# Patient Record
Sex: Female | Born: 1990 | Race: Black or African American | Hispanic: No | Marital: Single | Smoking: Never smoker
Health system: Southern US, Community
[De-identification: ages and names within clinical notes are randomized; demographics above are authoritative.]

## PROBLEM LIST (undated history)

## (undated) ENCOUNTER — Inpatient Hospital Stay (HOSPITAL_COMMUNITY): Payer: Self-pay

## (undated) DIAGNOSIS — O009 Unspecified ectopic pregnancy without intrauterine pregnancy: Secondary | ICD-10-CM

## (undated) DIAGNOSIS — K8020 Calculus of gallbladder without cholecystitis without obstruction: Secondary | ICD-10-CM

## (undated) HISTORY — PX: INDUCED ABORTION: SHX677

## (undated) HISTORY — PX: DILATION AND CURETTAGE OF UTERUS: SHX78

---
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
## 2009-04-07 ENCOUNTER — Ambulatory Visit: Payer: Self-pay | Admitting: Family Medicine

## 2009-04-07 ENCOUNTER — Inpatient Hospital Stay (HOSPITAL_COMMUNITY): Admission: AD | Admit: 2009-04-07 | Discharge: 2009-04-07 | Payer: Self-pay | Admitting: Obstetrics & Gynecology

## 2009-04-11 ENCOUNTER — Ambulatory Visit (HOSPITAL_COMMUNITY): Admission: RE | Admit: 2009-04-11 | Discharge: 2009-04-11 | Payer: Self-pay | Admitting: Obstetrics & Gynecology

## 2009-06-08 ENCOUNTER — Ambulatory Visit: Payer: Self-pay | Admitting: Obstetrics & Gynecology

## 2009-06-08 ENCOUNTER — Encounter: Payer: Self-pay | Admitting: Obstetrics and Gynecology

## 2009-06-08 LAB — CONVERTED CEMR LAB
Hgb F Quant: 0 % (ref 0.0–2.0)
Hgb S Quant: 38 % — ABNORMAL HIGH (ref 0.0–0.0)
Lymphocytes Relative: 11 % — ABNORMAL LOW (ref 12–46)
Lymphs Abs: 1.8 10*3/uL (ref 0.7–4.0)
Neutrophils Relative %: 79 % — ABNORMAL HIGH (ref 43–77)
Platelets: 279 10*3/uL (ref 150–400)
Rubella: 8.9 intl units/mL — ABNORMAL HIGH
WBC: 16 10*3/uL — ABNORMAL HIGH (ref 4.0–10.5)

## 2009-06-13 ENCOUNTER — Inpatient Hospital Stay (HOSPITAL_COMMUNITY): Admission: AD | Admit: 2009-06-13 | Discharge: 2009-06-14 | Payer: Self-pay | Admitting: Obstetrics & Gynecology

## 2009-06-17 ENCOUNTER — Ambulatory Visit (HOSPITAL_COMMUNITY): Admission: RE | Admit: 2009-06-17 | Discharge: 2009-06-17 | Payer: Self-pay | Admitting: Family Medicine

## 2009-06-22 ENCOUNTER — Ambulatory Visit: Payer: Self-pay | Admitting: Obstetrics & Gynecology

## 2009-07-13 ENCOUNTER — Ambulatory Visit: Payer: Self-pay | Admitting: Obstetrics & Gynecology

## 2009-08-03 ENCOUNTER — Ambulatory Visit: Payer: Self-pay | Admitting: Obstetrics and Gynecology

## 2009-08-04 ENCOUNTER — Encounter: Payer: Self-pay | Admitting: Obstetrics and Gynecology

## 2009-08-04 LAB — CONVERTED CEMR LAB: GC Probe Amp, Genital: NEGATIVE

## 2009-08-07 ENCOUNTER — Inpatient Hospital Stay (HOSPITAL_COMMUNITY): Admission: AD | Admit: 2009-08-07 | Discharge: 2009-08-07 | Payer: Self-pay | Admitting: Obstetrics & Gynecology

## 2009-08-17 ENCOUNTER — Other Ambulatory Visit: Payer: Self-pay | Admitting: Obstetrics and Gynecology

## 2009-08-17 ENCOUNTER — Ambulatory Visit: Payer: Self-pay | Admitting: Obstetrics and Gynecology

## 2009-08-19 ENCOUNTER — Inpatient Hospital Stay (HOSPITAL_COMMUNITY): Admission: AD | Admit: 2009-08-19 | Discharge: 2009-08-24 | Payer: Self-pay | Admitting: Obstetrics & Gynecology

## 2009-08-19 ENCOUNTER — Ambulatory Visit: Payer: Self-pay | Admitting: Obstetrics & Gynecology

## 2009-08-21 ENCOUNTER — Encounter: Payer: Self-pay | Admitting: Obstetrics & Gynecology

## 2010-11-09 ENCOUNTER — Inpatient Hospital Stay (HOSPITAL_COMMUNITY)
Admission: AD | Admit: 2010-11-09 | Discharge: 2010-11-09 | Payer: Self-pay | Source: Home / Self Care | Admitting: Obstetrics and Gynecology

## 2011-03-09 LAB — ANAEROBIC CULTURE

## 2011-03-09 LAB — POCT URINALYSIS DIP (DEVICE)
Bilirubin Urine: NEGATIVE
Bilirubin Urine: NEGATIVE
Glucose, UA: NEGATIVE mg/dL
Glucose, UA: NEGATIVE mg/dL
Hgb urine dipstick: NEGATIVE
Ketones, ur: NEGATIVE mg/dL
Ketones, ur: NEGATIVE mg/dL
Protein, ur: NEGATIVE mg/dL
Specific Gravity, Urine: 1.01 (ref 1.005–1.030)
Specific Gravity, Urine: <=1.005 (ref 1.005–1.030)

## 2011-03-09 LAB — CBC
HCT: 27.6 % — ABNORMAL LOW (ref 36.0–46.0)
MCV: 80.8 fL (ref 78.0–100.0)
Platelets: 296 10*3/uL (ref 150–400)
Platelets: 340 10*3/uL (ref 150–400)
RDW: 16.5 % — ABNORMAL HIGH (ref 11.5–15.5)
WBC: 13.2 10*3/uL — ABNORMAL HIGH (ref 4.0–10.5)
WBC: 21.9 10*3/uL — ABNORMAL HIGH (ref 4.0–10.5)

## 2011-03-09 LAB — RPR: RPR Ser Ql: NONREACTIVE

## 2011-03-09 LAB — WOUND CULTURE

## 2011-03-10 LAB — POCT URINALYSIS DIP (DEVICE)
Bilirubin Urine: NEGATIVE
Glucose, UA: NEGATIVE mg/dL
Hgb urine dipstick: NEGATIVE
Ketones, ur: NEGATIVE mg/dL
Specific Gravity, Urine: 1.01 (ref 1.005–1.030)
pH: 7 (ref 5.0–8.0)

## 2011-03-11 LAB — POCT URINALYSIS DIP (DEVICE)
Bilirubin Urine: NEGATIVE
Glucose, UA: NEGATIVE mg/dL
Hgb urine dipstick: NEGATIVE
Ketones, ur: NEGATIVE mg/dL
Ketones, ur: NEGATIVE mg/dL
Protein, ur: NEGATIVE mg/dL
Protein, ur: NEGATIVE mg/dL
Specific Gravity, Urine: 1.01 (ref 1.005–1.030)
Specific Gravity, Urine: <=1.005 (ref 1.005–1.030)
Urobilinogen, UA: 0.2 mg/dL (ref 0.0–1.0)
pH: 7 (ref 5.0–8.0)

## 2011-03-11 LAB — URINE MICROSCOPIC-ADD ON: RBC / HPF: NONE SEEN RBC/hpf (ref <3)

## 2011-03-11 LAB — URINALYSIS, ROUTINE W REFLEX MICROSCOPIC
Glucose, UA: NEGATIVE mg/dL
Hgb urine dipstick: NEGATIVE
Protein, ur: NEGATIVE mg/dL
pH: 5.5 (ref 5.0–8.0)

## 2011-03-11 LAB — URINE CULTURE

## 2011-03-13 LAB — URINE MICROSCOPIC-ADD ON

## 2011-03-13 LAB — URINALYSIS, ROUTINE W REFLEX MICROSCOPIC
Bilirubin Urine: NEGATIVE
Glucose, UA: NEGATIVE mg/dL
Hgb urine dipstick: NEGATIVE
Specific Gravity, Urine: 1.015 (ref 1.005–1.030)
pH: 6.5 (ref 5.0–8.0)

## 2011-03-13 LAB — URINE CULTURE

## 2011-08-03 ENCOUNTER — Inpatient Hospital Stay (INDEPENDENT_AMBULATORY_CARE_PROVIDER_SITE_OTHER)
Admission: RE | Admit: 2011-08-03 | Discharge: 2011-08-03 | Disposition: A | Discharge status: Home / Self Care | Payer: Self-pay | Source: Ambulatory Visit | Attending: Family Medicine | Admitting: Family Medicine

## 2011-08-03 DIAGNOSIS — IMO0001 Reserved for inherently not codable concepts without codable children: Secondary | ICD-10-CM

## 2011-08-31 ENCOUNTER — Inpatient Hospital Stay (INDEPENDENT_AMBULATORY_CARE_PROVIDER_SITE_OTHER)
Admission: RE | Admit: 2011-08-31 | Discharge: 2011-08-31 | Disposition: A | Discharge status: Home / Self Care | Payer: Self-pay | Source: Ambulatory Visit | Attending: Family Medicine | Admitting: Family Medicine

## 2011-08-31 DIAGNOSIS — J069 Acute upper respiratory infection, unspecified: Secondary | ICD-10-CM

## 2011-08-31 LAB — POCT RAPID STREP A: Streptococcus, Group A Screen (Direct): NEGATIVE

## 2011-10-02 ENCOUNTER — Emergency Department (HOSPITAL_COMMUNITY)
Admission: EM | Admit: 2011-10-02 | Discharge: 2011-10-03 | Disposition: A | Discharge status: Home / Self Care | Payer: Self-pay | Attending: Emergency Medicine | Admitting: Emergency Medicine

## 2011-10-02 ENCOUNTER — Emergency Department (HOSPITAL_COMMUNITY): Payer: Self-pay

## 2011-10-02 ENCOUNTER — Ambulatory Visit (INDEPENDENT_AMBULATORY_CARE_PROVIDER_SITE_OTHER): Payer: Self-pay

## 2011-10-02 ENCOUNTER — Inpatient Hospital Stay (INDEPENDENT_AMBULATORY_CARE_PROVIDER_SITE_OTHER)
Admission: RE | Admit: 2011-10-02 | Discharge: 2011-10-02 | Disposition: A | Discharge status: Home / Self Care | Payer: Self-pay | Source: Ambulatory Visit | Attending: Family Medicine | Admitting: Family Medicine

## 2011-10-02 DIAGNOSIS — I880 Nonspecific mesenteric lymphadenitis: Secondary | ICD-10-CM | POA: Insufficient documentation

## 2011-10-02 DIAGNOSIS — R110 Nausea: Secondary | ICD-10-CM | POA: Insufficient documentation

## 2011-10-02 DIAGNOSIS — R109 Unspecified abdominal pain: Secondary | ICD-10-CM | POA: Insufficient documentation

## 2011-10-02 DIAGNOSIS — N898 Other specified noninflammatory disorders of vagina: Secondary | ICD-10-CM | POA: Insufficient documentation

## 2011-10-02 DIAGNOSIS — K5900 Constipation, unspecified: Secondary | ICD-10-CM | POA: Insufficient documentation

## 2011-10-02 DIAGNOSIS — R10813 Right lower quadrant abdominal tenderness: Secondary | ICD-10-CM | POA: Insufficient documentation

## 2011-10-02 LAB — COMPREHENSIVE METABOLIC PANEL
CO2: 26 mEq/L (ref 19–32)
Calcium: 9.8 mg/dL (ref 8.4–10.5)
Creatinine, Ser: 0.6 mg/dL (ref 0.50–1.10)
GFR calc Af Amer: >90 mL/min (ref >90)
GFR calc non Af Amer: >90 mL/min (ref >90)
Glucose, Bld: 93 mg/dL (ref 70–99)
Total Bilirubin: 0.8 mg/dL (ref 0.3–1.2)

## 2011-10-02 LAB — CBC
HCT: 37.3 % (ref 36.0–46.0)
Hemoglobin: 13.4 g/dL (ref 12.0–15.0)
MCH: 27.5 pg (ref 26.0–34.0)
MCV: 76.4 fL — ABNORMAL LOW (ref 78.0–100.0)
RBC: 4.88 MIL/uL (ref 3.87–5.11)

## 2011-10-02 LAB — POCT PREGNANCY, URINE: Preg Test, Ur: NEGATIVE

## 2011-10-02 LAB — DIFFERENTIAL
Lymphocytes Relative: 14 % (ref 12–46)
Lymphs Abs: 1.7 10*3/uL (ref 0.7–4.0)
Monocytes Relative: 10 % (ref 3–12)
Neutro Abs: 9.3 10*3/uL — ABNORMAL HIGH (ref 1.7–7.7)
Neutrophils Relative %: 75 % (ref 43–77)

## 2011-10-02 LAB — POCT URINALYSIS DIP (DEVICE)
Glucose, UA: NEGATIVE mg/dL
Ketones, ur: 40 mg/dL — AB
Protein, ur: NEGATIVE mg/dL
Specific Gravity, Urine: 1.02 (ref 1.005–1.030)

## 2011-10-03 MED ORDER — IOHEXOL 300 MG/ML  SOLN
100.0000 mL | Freq: Once | INTRAMUSCULAR | Status: AC | PRN
  Administered 2011-10-03: 100 mL via INTRAVENOUS

## 2011-10-06 ENCOUNTER — Emergency Department (HOSPITAL_COMMUNITY): Payer: Self-pay

## 2011-10-06 ENCOUNTER — Emergency Department (HOSPITAL_COMMUNITY)
Admission: EM | Admit: 2011-10-06 | Discharge: 2011-10-06 | Disposition: A | Discharge status: Home / Self Care | Payer: Self-pay | Attending: Emergency Medicine | Admitting: Emergency Medicine

## 2011-10-06 DIAGNOSIS — N390 Urinary tract infection, site not specified: Secondary | ICD-10-CM | POA: Insufficient documentation

## 2011-10-06 DIAGNOSIS — R109 Unspecified abdominal pain: Secondary | ICD-10-CM | POA: Insufficient documentation

## 2011-10-06 LAB — DIFFERENTIAL
Basophils Absolute: 0 10*3/uL (ref 0.0–0.1)
Basophils Relative: 0 % (ref 0–1)
Lymphocytes Relative: 10 % — ABNORMAL LOW (ref 12–46)
Monocytes Absolute: 1.3 10*3/uL — ABNORMAL HIGH (ref 0.1–1.0)
Neutro Abs: 8.4 10*3/uL — ABNORMAL HIGH (ref 1.7–7.7)
Neutrophils Relative %: 77 % (ref 43–77)

## 2011-10-06 LAB — URINALYSIS, ROUTINE W REFLEX MICROSCOPIC
Bilirubin Urine: NEGATIVE
Nitrite: POSITIVE — AB
Specific Gravity, Urine: 1.016 (ref 1.005–1.030)
pH: 5.5 (ref 5.0–8.0)

## 2011-10-06 LAB — COMPREHENSIVE METABOLIC PANEL
ALT: 32 U/L (ref 0–35)
Alkaline Phosphatase: 50 U/L (ref 39–117)
BUN: 6 mg/dL (ref 6–23)
CO2: 25 mEq/L (ref 19–32)
Chloride: 100 mEq/L (ref 96–112)
GFR calc Af Amer: >90 mL/min (ref >90)
GFR calc non Af Amer: >90 mL/min (ref >90)
Glucose, Bld: 103 mg/dL — ABNORMAL HIGH (ref 70–99)
Potassium: 3.2 mEq/L — ABNORMAL LOW (ref 3.5–5.1)
Sodium: 138 mEq/L (ref 135–145)
Total Bilirubin: 0.5 mg/dL (ref 0.3–1.2)

## 2011-10-06 LAB — CBC
HCT: 34.7 % — ABNORMAL LOW (ref 36.0–46.0)
Hemoglobin: 12 g/dL (ref 12.0–15.0)
MCHC: 34.6 g/dL (ref 30.0–36.0)
RBC: 4.55 MIL/uL (ref 3.87–5.11)
WBC: 10.9 10*3/uL — ABNORMAL HIGH (ref 4.0–10.5)

## 2011-10-06 LAB — LIPASE, BLOOD: Lipase: 20 U/L (ref 11–59)

## 2011-10-06 LAB — POCT PREGNANCY, URINE: Preg Test, Ur: NEGATIVE

## 2011-10-06 LAB — URINE MICROSCOPIC-ADD ON

## 2011-10-08 LAB — URINE CULTURE: Culture  Setup Time: 201211031205

## 2011-10-09 NOTE — ED Notes (Signed)
+   urine Chart sent to EDP for review.

## 2011-10-16 NOTE — ED Notes (Signed)
rx for Keflex 500 mg QID x 5 days written by Vedia Pereyra.

## 2012-01-26 IMAGING — US US ABDOMEN COMPLETE
1 series · 14 of 25 positions shown · non-contrast
Comparison: 10/02/2011 CT

CLINICAL DATA: There were right upper quadrant abdominal pain.

COMPLETE ABDOMINAL ULTRASOUND

[Series 1: us abdomen complete · 0.26mm/px · 14 of 85 slices shown]
[im 1/85]
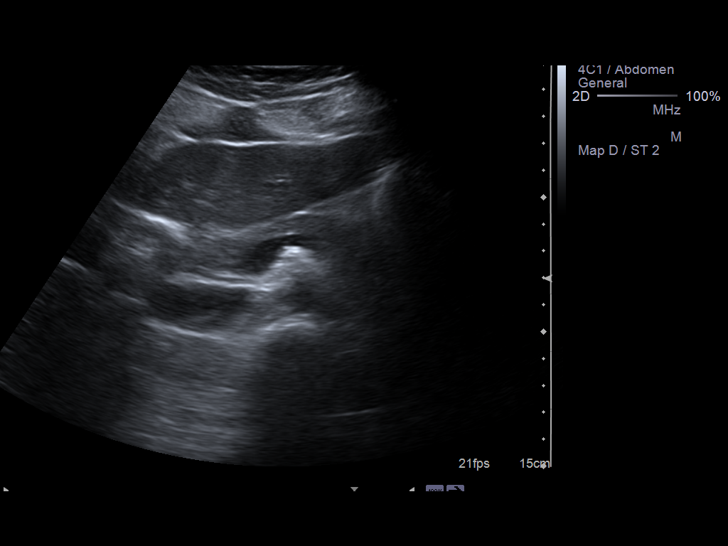
[im 8/85]
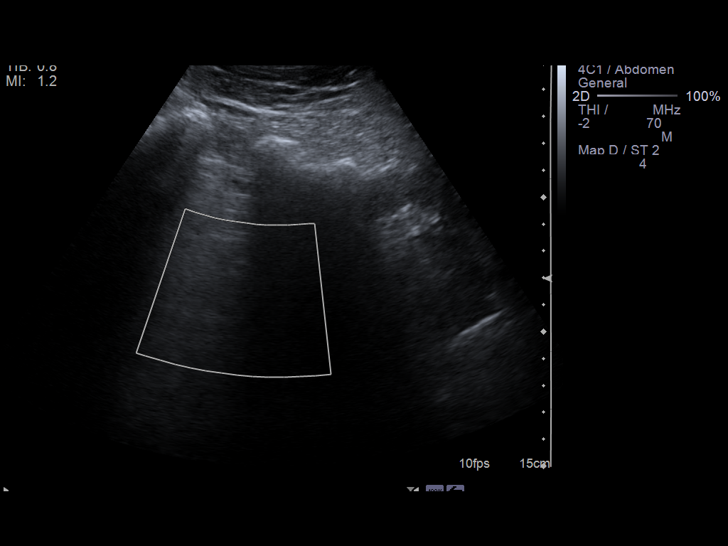
[im 15/85]
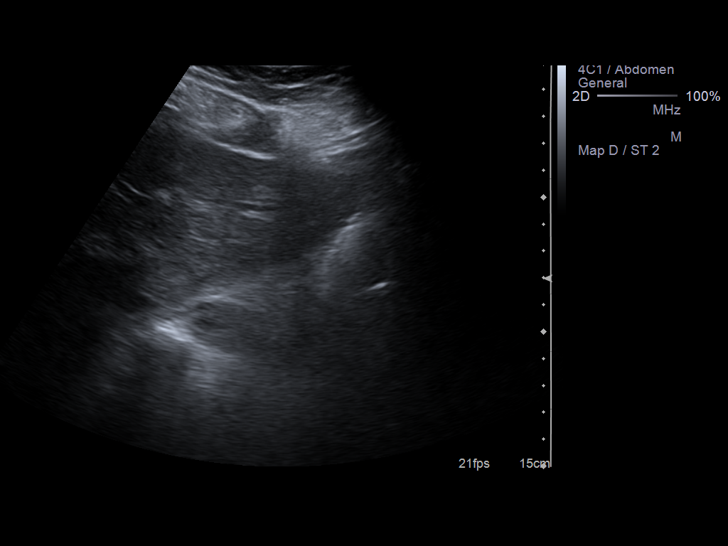
[im 22/85]
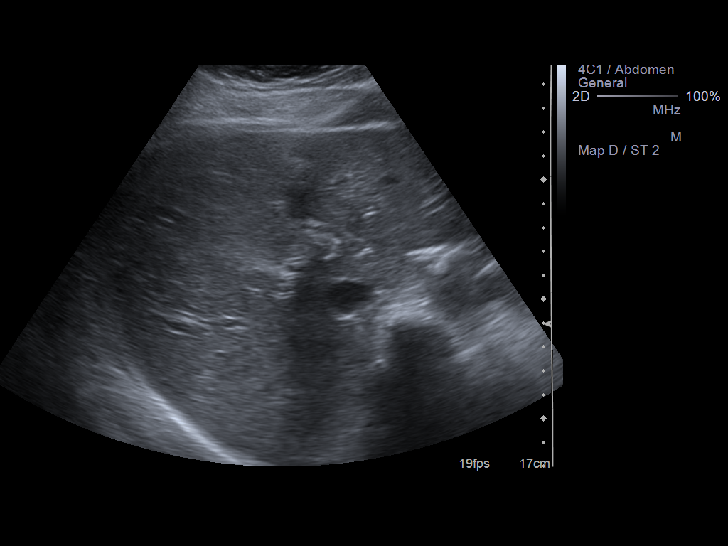
[im 29/85]
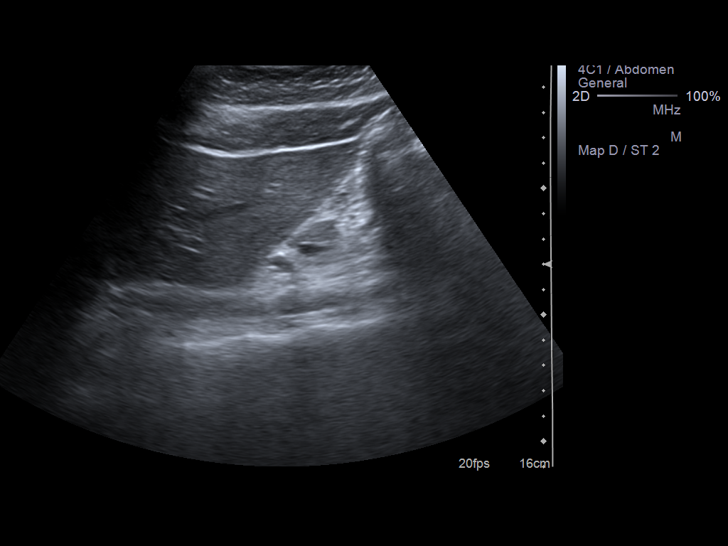
[im 32/85]
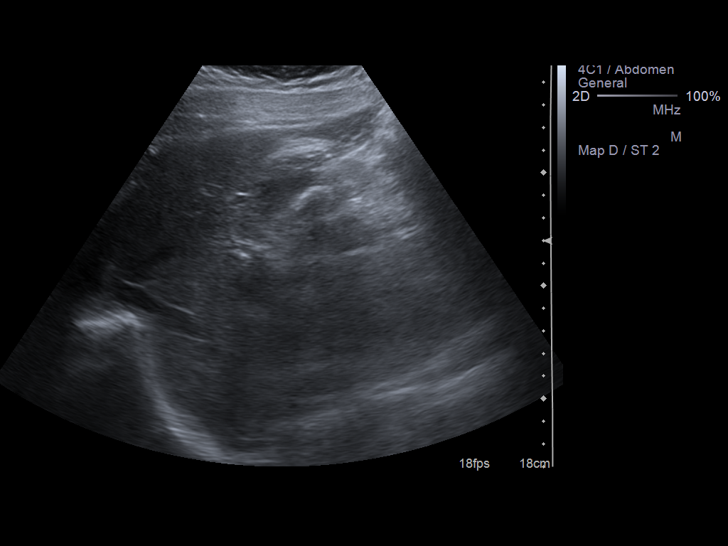
[im 39/85]
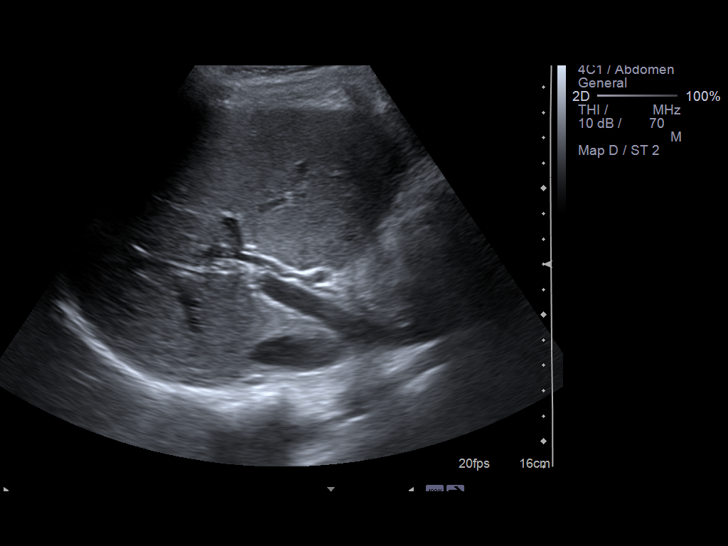
[im 46/85]
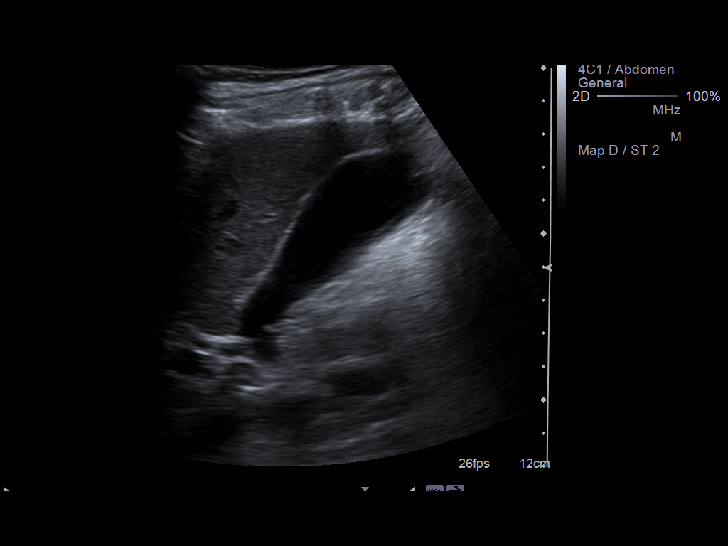
[im 53/85]
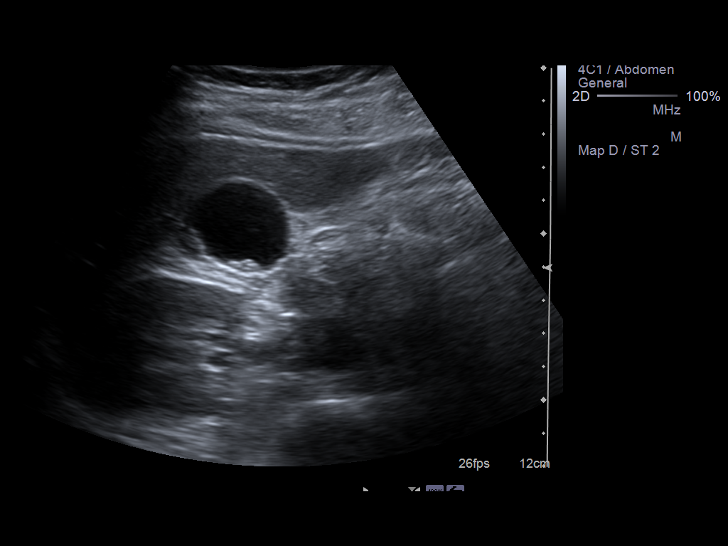
[im 57/85]
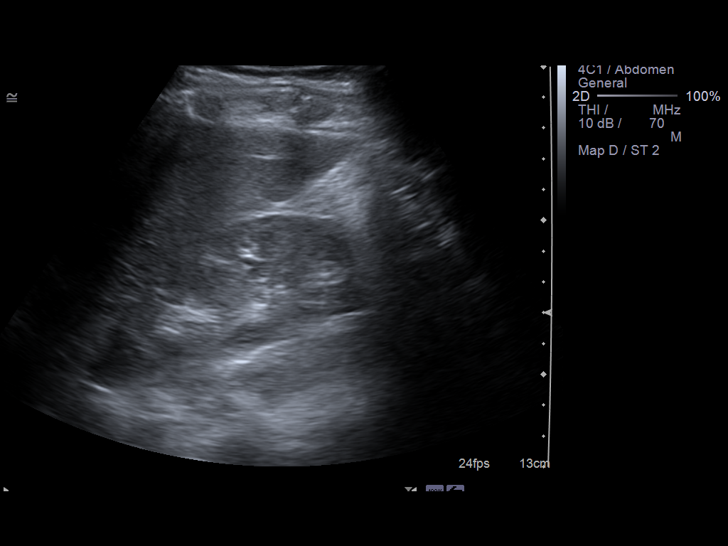
[im 64/85]
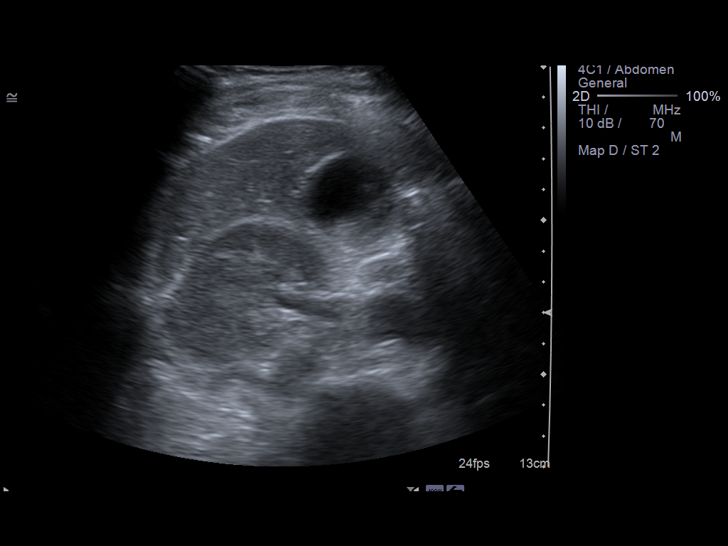
[im 71/85]
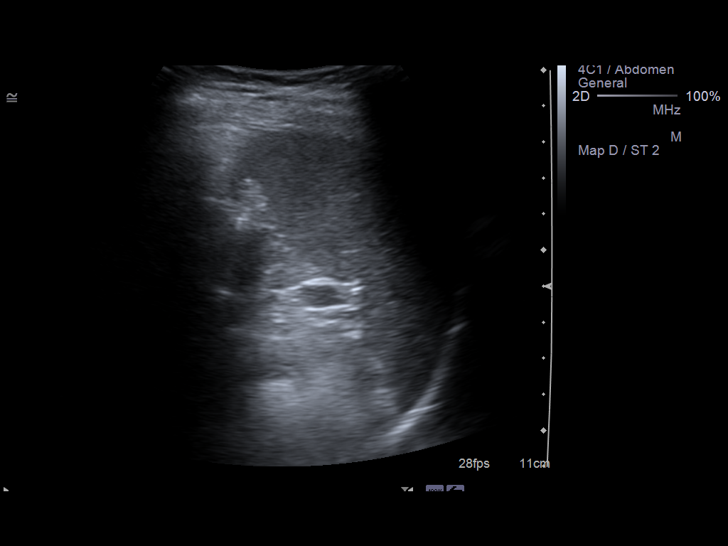
[im 78/85]
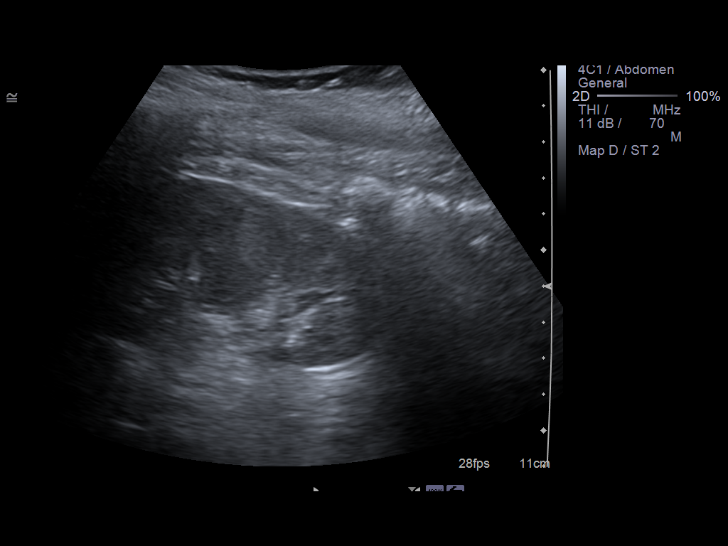
[im 85/85]
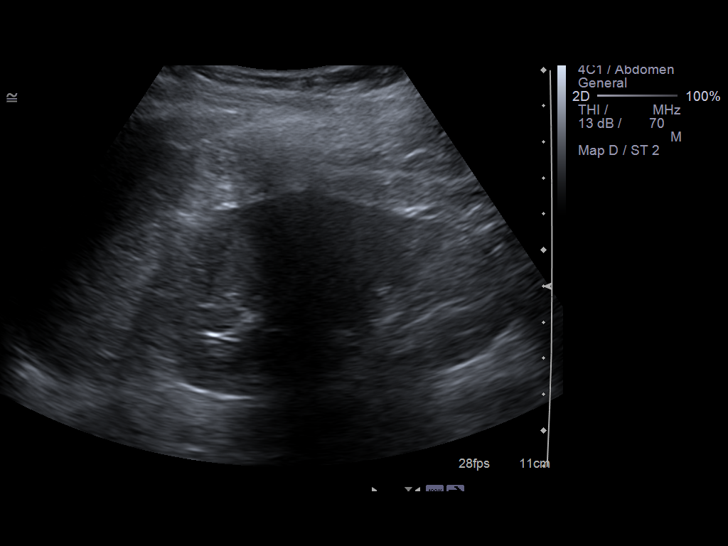

[14 of 25 positions shown; findings below may reference images not displayed]

FINDINGS: Gallbladder:  Questionable nonshadowing gallstones versus sludge.
No gallbladder wall thickening or pericholecystic fluid.  Negative
sonographic Murphy's sign.

Common bile duct:  Normal in diameter, measuring up to 3 mm.

Liver:  No focal lesion identified.  Within normal limits in
parenchymal echogenicity.

IVC:  Appears normal.

Pancreas:  Limited visualization of the distal body and tail
secondary to overlying bowel gas artifact.  Where visualized, the
pancreas has a normal sonographic appearance.

Spleen:  Normal in diameter and sonographic appearance.

Right Kidney:  Normal appearance without hydronephrosis.  Measures
10.4 cm.

Left Kidney:  Normal sonographic appearance.  No hydronephrosis.
Measures 10.2 cm.

Abdominal aorta:  No aneurysm identified.  The distal aorta to the
bifurcation is not visualized secondary to overlying bowel gas
artifact.  Where seen, measures 2.3 cm in maximum diameter.
IMPRESSION: Biliary sludge versus nonshadowing stones.  No sonographic evidence
for cholecystitis.

## 2012-03-07 ENCOUNTER — Emergency Department (HOSPITAL_COMMUNITY)
Admission: EM | Admit: 2012-03-07 | Discharge: 2012-03-07 | Disposition: A | Discharge status: Home / Self Care | Payer: Self-pay | Attending: Emergency Medicine | Admitting: Emergency Medicine

## 2012-03-07 ENCOUNTER — Encounter (HOSPITAL_COMMUNITY): Payer: Self-pay | Admitting: Emergency Medicine

## 2012-03-07 DIAGNOSIS — IMO0001 Reserved for inherently not codable concepts without codable children: Secondary | ICD-10-CM | POA: Insufficient documentation

## 2012-03-07 DIAGNOSIS — R197 Diarrhea, unspecified: Secondary | ICD-10-CM | POA: Insufficient documentation

## 2012-03-07 DIAGNOSIS — R112 Nausea with vomiting, unspecified: Secondary | ICD-10-CM | POA: Insufficient documentation

## 2012-03-07 DIAGNOSIS — N390 Urinary tract infection, site not specified: Secondary | ICD-10-CM | POA: Insufficient documentation

## 2012-03-07 DIAGNOSIS — R6883 Chills (without fever): Secondary | ICD-10-CM | POA: Insufficient documentation

## 2012-03-07 LAB — DIFFERENTIAL
Basophils Absolute: 0 10*3/uL (ref 0.0–0.1)
Basophils Relative: 0 % (ref 0–1)
Lymphocytes Relative: 7 % — ABNORMAL LOW (ref 12–46)
Monocytes Absolute: 1.4 10*3/uL — ABNORMAL HIGH (ref 0.1–1.0)
Monocytes Relative: 7 % (ref 3–12)
Neutro Abs: 16.2 10*3/uL — ABNORMAL HIGH (ref 1.7–7.7)
Neutrophils Relative %: 86 % — ABNORMAL HIGH (ref 43–77)

## 2012-03-07 LAB — POCT I-STAT, CHEM 8
BUN: 13 mg/dL (ref 6–23)
Calcium, Ion: 1.22 mmol/L (ref 1.12–1.32)
Chloride: 104 mEq/L (ref 96–112)
Glucose, Bld: 126 mg/dL — ABNORMAL HIGH (ref 70–99)
HCT: 43 % (ref 36.0–46.0)
Potassium: 3.4 mEq/L — ABNORMAL LOW (ref 3.5–5.1)

## 2012-03-07 LAB — URINALYSIS, ROUTINE W REFLEX MICROSCOPIC
Bilirubin Urine: NEGATIVE
Glucose, UA: 500 mg/dL — AB
Ketones, ur: NEGATIVE mg/dL
Specific Gravity, Urine: 1.017 (ref 1.005–1.030)
pH: 7 (ref 5.0–8.0)

## 2012-03-07 LAB — CBC
HCT: 40.2 % (ref 36.0–46.0)
Hemoglobin: 13.3 g/dL (ref 12.0–15.0)
MCHC: 33.1 g/dL (ref 30.0–36.0)
WBC: 18.9 10*3/uL — ABNORMAL HIGH (ref 4.0–10.5)

## 2012-03-07 LAB — URINE MICROSCOPIC-ADD ON

## 2012-03-07 MED ORDER — DEXTROSE 5 % IV SOLN
1.0000 g | Freq: Once | INTRAVENOUS | Status: AC
  Administered 2012-03-07: 1 g via INTRAVENOUS
  Filled 2012-03-07: qty 10

## 2012-03-07 MED ORDER — SULFAMETHOXAZOLE-TRIMETHOPRIM 800-160 MG PO TABS: 1.0000 | ORAL_TABLET | Freq: Two times a day (BID) | ORAL | Status: AC

## 2012-03-07 MED ORDER — ONDANSETRON 8 MG PO TBDP: 8.0000 mg | ORAL_TABLET | Freq: Three times a day (TID) | ORAL | Status: AC | PRN

## 2012-03-07 MED ORDER — SODIUM CHLORIDE 0.9 % IV BOLUS (SEPSIS)
500.0000 mL | Freq: Once | INTRAVENOUS | Status: AC
  Administered 2012-03-07: 500 mL via INTRAVENOUS

## 2012-03-07 MED ORDER — ONDANSETRON HCL 4 MG/2ML IJ SOLN
4.0000 mg | Freq: Once | INTRAMUSCULAR | Status: AC
  Administered 2012-03-07: 4 mg via INTRAVENOUS
  Filled 2012-03-07: qty 2

## 2012-03-07 NOTE — ED Provider Notes (Signed)
History     CSN: [id number]  Arrival date & time 03/07/12  4098   First MD Initiated Contact with Patient 03/07/12 0425      Chief Complaint  Patient presents with  . Generalized Body Aches    (Consider location/radiation/quality/duration/timing/severity/associated sxs/prior treatment) HPI Comments: Patient woke up at 1:30 in the morning with myalgias, nausea, vomiting, diarrhea, comes to the emergency room now having rigers  The history is provided by the patient.    History reviewed. No pertinent past medical history.  History reviewed. No pertinent past surgical history.  No family history on file.  History  Substance Use Topics  . Smoking status: Never Smoker   . Smokeless tobacco: Not on file  . Alcohol Use: No    OB History    Grav Para Term Preterm Abortions TAB SAB Ect Mult Living                  Review of Systems  Constitutional: Positive for chills. Negative for fever.  HENT: Negative for congestion, sore throat and rhinorrhea.   Gastrointestinal: Positive for nausea, vomiting, abdominal pain and diarrhea.  Genitourinary: Negative for dysuria and flank pain.  Musculoskeletal: Positive for myalgias and back pain.  Neurological: Negative for dizziness, weakness and headaches.    Allergies  Review of patient's allergies indicates no known allergies.  Home Medications  No current outpatient prescriptions on file.  BP 124/67  Pulse 79  Temp(Src) 97.8 F (36.6 C) (Oral)  Resp 18  SpO2 99%  Physical Exam  Constitutional: She appears well-developed and well-nourished.  HENT:  Head: Normocephalic.  Eyes: Pupils are equal, round, and reactive to light.  Neck: Normal range of motion.  Cardiovascular: Normal rate.   Pulmonary/Chest: Effort normal.  Abdominal: Soft.  Genitourinary:       Currently on second day of menses normal.  Time frame  Musculoskeletal: Normal range of motion.  Neurological: She is alert.  Skin: Skin is warm.    ED Course   Procedures (including critical care time)   Labs Reviewed  CBC  DIFFERENTIAL  URINALYSIS, ROUTINE W REFLEX MICROSCOPIC   No results found.   No diagnosis found.  5:50 AM patient is feeling, better.  She's no longer having rigers no longer vomiting  MDM  N/V myalgias        Arman Filter, NP 03/07/12 [phone]

## 2012-03-07 NOTE — ED Notes (Signed)
PT. REPORTS " IT HURTS ALL OVER - AT MY BACK , LEGS, ARMS AND ABDOMEN " ONSET THIS EVENING , STATES 2ND DAY OF HER PERIOD. NO FEVER OR CHILLS . DENIES NAUSEA OR VOMITTING.

## 2012-03-07 NOTE — ED Provider Notes (Signed)
History     CSN: [id number]  Arrival date & time 03/07/12  2956   First MD Initiated Contact with Patient 03/07/12 0425      Chief Complaint  Patient presents with  . Generalized Body Aches    (Consider location/radiation/quality/duration/timing/severity/associated sxs/prior treatment) HPI  History reviewed. No pertinent past medical history.  History reviewed. No pertinent past surgical history.  No family history on file.  History  Substance Use Topics  . Smoking status: Never Smoker   . Smokeless tobacco: Not on file  . Alcohol Use: No    OB History    Grav Para Term Preterm Abortions TAB SAB Ect Mult Living                  Review of Systems  Allergies  Review of patient's allergies indicates no known allergies.  Home Medications   Current Outpatient Rx  Name Route Sig Dispense Refill  . SULFAMETHOXAZOLE-TRIMETHOPRIM 800-160 MG PO TABS Oral Take 1 tablet by mouth every 12 (twelve) hours. 10 tablet 0    BP 97/54  Pulse 85  Temp(Src) 98.3 F (36.8 C) (Rectal)  Resp 18  SpO2 96%  Physical Exam  ED Course  Procedures (including critical care time)  Labs Reviewed  CBC - Abnormal; Notable for the following:    WBC 18.9 (*)    RBC 5.27 (*)    MCV 76.3 (*)    MCH 25.2 (*)    All other components within normal limits  DIFFERENTIAL - Abnormal; Notable for the following:    Neutrophils Relative 86 (*)    Neutro Abs 16.2 (*)    Lymphocytes Relative 7 (*)    Monocytes Absolute 1.4 (*)    All other components within normal limits  URINALYSIS, ROUTINE W REFLEX MICROSCOPIC - Abnormal; Notable for the following:    APPearance CLOUDY (*)    Glucose, UA 500 (*)    Hgb urine dipstick MODERATE (*)    Nitrite POSITIVE (*)    All other components within normal limits  POCT I-STAT, CHEM 8 - Abnormal; Notable for the following:    Potassium 3.4 (*)    Glucose, Bld 126 (*)    All other components within normal limits  URINE MICROSCOPIC-ADD ON - Abnormal;  Notable for the following:    Bacteria, UA MANY (*)    All other components within normal limits  POCT PREGNANCY, URINE  URINE CULTURE   No results found.   1. UTI (lower urinary tract infection)       MDM  I have taken over care of this patient from Volney American, NP - please see her note for HPI, ROS and PE.  Patient is able to tolerate po fluids and feels comfortable to go home, will place on antibiotics and give short course of nausea medication as well.        Izola Price [place], [place] 03/07/12 [phone]

## 2012-03-07 NOTE — ED Provider Notes (Signed)
Medical screening examination/treatment/procedure(s) were performed by non-physician practitioner and as supervising physician I was immediately available for consultation/collaboration.  Olivia Mackie, MD 03/07/12 [phone]

## 2012-03-07 NOTE — ED Provider Notes (Signed)
Medical screening examination/treatment/procedure(s) were performed by non-physician practitioner and as supervising physician I was immediately available for consultation/collaboration.  Olivia Mackie, MD 03/07/12 [phone]

## 2012-03-07 NOTE — Discharge Instructions (Signed)
Urinary Tract Infection A urinary tract infection (UTI) is often caused by a germ (bacteria). A UTI is usually helped with medicine (antibiotics) that kills germs. Take all the medicine until it is gone. Do this even if you are feeling better. You are usually better in 7 to 10 days. HOME CARE   Drink enough water and fluids to keep your pee (urine) clear or pale yellow. Drink:   Cranberry juice.   Water.   Avoid:   Caffeine.   Tea.   Bubbly (carbonated) drinks.   Alcohol.   Only take medicine as told by your doctor.   To prevent further infections:   Pee often.   After pooping (bowel movement), women should wipe from front to back. Use each tissue only once.   Pee before and after having sex (intercourse).  Ask your doctor when your test results will be ready. Make sure you follow up and get your test results.  GET HELP RIGHT AWAY IF:   There is very bad back pain or lower belly (abdominal) pain.   You get the chills.   You have a fever.   Your baby is older than 3 months with a rectal temperature of 102 F (38.9 C) or higher.   Your baby is 16 months old or younger with a rectal temperature of 100.4 F (38 C) or higher.   You feel sick to your stomach (nauseous) or throw up (vomit).   There is continued burning with peeing.   Your problems are not better in 3 days. Return sooner if you are getting worse.  MAKE SURE YOU:   Understand these instructions.   Will watch your condition.   Will get help right away if you are not doing well or get worse.  Document Released: 05/07/2008 Document Revised: 11/08/2011 Document Reviewed: 05/07/2008 [facility] Patient Information 2012 [place], [place]. He was given IV antibiotics in the emergency department for urinary tract infection.  It also been given a prescription to take her Septra.  Please take this as directed until all tablets have been consumed.  You've also been given a Research scientist (life sciences).  Please try to  establish himself with a primary care physician RESOURCE GUIDE  Dental Problems  Patients with Medicaid: [facility] [id number]                                           [phone] [address] Phone:  [phone]                                                  Phone:  [phone]  If unable to pay or uninsured, contact:  [facility] or [facility]. to become qualified for the adult dental clinic.  Chronic Pain Problems Contact [facility]  [phone] Patients need to be referred by their primary care doctor.  Insufficient Money for Medicine Contact [employer] Way:  call "211" or [facility] [phone].  No Primary Care Doctor Call [facility]  [phone] Other agencies that provide inexpensive medical care    [facility]  Family Medicine  [phone]    [facility] Internal Medicine  [phone]    [facility]  [phone]    [facility]  [phone]    [facility]  [phone]    [facility]  [phone]  Psychological Services [facility]al [facility]  [phone] [facility]  [phone] [facility] Mental Health   [phone] (emergency services [phone])  Substance Abuse Resources [facility]  [phone] [facility] [phone] [facility] [phone] Floydene Flock [phone] Residential & Outpatient Substance Abuse Program  [phone]  Abuse/Neglect [facility] Child Abuse Hotline (914)521-0805 [facility] Child Abuse Hotline [phone] (After Hours)  Emergency Shelter [facility] [phone]  M[facility] Homes [facility] [phone] [facility] [phone]  MRSA Hotline #:   (984) 466-6835    [facility] Resources  [facility]     [employer]                          [facility] Dept. [address]  [place]                       [address]      [address]  Blondell Reveal Phone:  [phone]                                   Phone:  [phone]                 Phone:  [phone]  [facility] Mental Health Phone:  [phone]  [facility] Child Abuse Hotline 9318525412 7141656053 (After Hours)

## 2012-03-07 NOTE — ED Notes (Signed)
Pt states that she has been having generalized body aches starting yesterday at 1300. Pain in her back, extremities and chest. Pain increases when pt takes deep breaths. Pt complaining of chills.  No coughing. Pt stated that she started her cycle two days ago. Pain 10 out of 10. No cardiac or respiratory distress. Will continue to monitor.

## 2012-03-09 LAB — URINE CULTURE: Culture  Setup Time: 201304051146

## 2012-03-10 NOTE — ED Notes (Signed)
+   Urine  Treated per protocol MD; Sensitive to same 

## 2012-03-10 NOTE — ED Notes (Signed)
Chart sent to EDP office for review. Chart returned from EDP office. Prescribed Cipro 500 mg PO BID x 3 days. Prescribed by Trixie Dredge PA-C.

## 2012-03-13 NOTE — ED Notes (Signed)
Customer unavailable

## 2012-03-15 NOTE — ED Notes (Signed)
Sent letter to EPIC address 

## 2012-04-15 NOTE — ED Notes (Signed)
No response after 30 days, Chart closed out and sent to Medical Records.

## 2012-12-26 ENCOUNTER — Encounter (HOSPITAL_COMMUNITY): Payer: Self-pay

## 2012-12-26 ENCOUNTER — Emergency Department (HOSPITAL_COMMUNITY): Payer: Self-pay

## 2012-12-26 ENCOUNTER — Emergency Department (HOSPITAL_COMMUNITY)
Admission: EM | Admit: 2012-12-26 | Discharge: 2012-12-26 | Disposition: A | Discharge status: Home / Self Care | Payer: Self-pay | Attending: Emergency Medicine | Admitting: Emergency Medicine

## 2012-12-26 DIAGNOSIS — N390 Urinary tract infection, site not specified: Secondary | ICD-10-CM | POA: Insufficient documentation

## 2012-12-26 DIAGNOSIS — F17200 Nicotine dependence, unspecified, uncomplicated: Secondary | ICD-10-CM | POA: Insufficient documentation

## 2012-12-26 DIAGNOSIS — M549 Dorsalgia, unspecified: Secondary | ICD-10-CM | POA: Insufficient documentation

## 2012-12-26 LAB — URINE MICROSCOPIC-ADD ON

## 2012-12-26 LAB — BASIC METABOLIC PANEL WITH GFR
BUN: 13 mg/dL (ref 6–23)
CO2: 27 meq/L (ref 19–32)
Calcium: 9.5 mg/dL (ref 8.4–10.5)
Chloride: 105 meq/L (ref 96–112)
Creatinine, Ser: 0.7 mg/dL (ref 0.50–1.10)
GFR calc Af Amer: >90 mL/min (ref >90)
GFR calc non Af Amer: >90 mL/min (ref >90)
Glucose, Bld: 95 mg/dL (ref 70–99)
Potassium: 3.7 meq/L (ref 3.5–5.1)
Sodium: 141 meq/L (ref 135–145)

## 2012-12-26 LAB — CBC WITH DIFFERENTIAL/PLATELET
Basophils Absolute: 0 K/uL (ref 0.0–0.1)
Basophils Relative: 0 % (ref 0–1)
Eosinophils Absolute: 0.1 K/uL (ref 0.0–0.7)
Eosinophils Relative: 0 % (ref 0–5)
HCT: 39 % (ref 36.0–46.0)
Hemoglobin: 13.6 g/dL (ref 12.0–15.0)
Lymphocytes Relative: 9 % — ABNORMAL LOW (ref 12–46)
Lymphs Abs: 1.3 K/uL (ref 0.7–4.0)
MCH: 27 pg (ref 26.0–34.0)
MCHC: 34.9 g/dL (ref 30.0–36.0)
MCV: 77.4 fL — ABNORMAL LOW (ref 78.0–100.0)
Monocytes Absolute: 1 K/uL (ref 0.1–1.0)
Monocytes Relative: 6 % (ref 3–12)
Neutro Abs: 12.7 K/uL — ABNORMAL HIGH (ref 1.7–7.7)
Neutrophils Relative %: 85 % — ABNORMAL HIGH (ref 43–77)
Platelets: 388 K/uL (ref 150–400)
RBC: 5.04 MIL/uL (ref 3.87–5.11)
RDW: 14.4 % (ref 11.5–15.5)
WBC: 15 K/uL — ABNORMAL HIGH (ref 4.0–10.5)

## 2012-12-26 LAB — URINALYSIS, ROUTINE W REFLEX MICROSCOPIC
Bilirubin Urine: NEGATIVE
Glucose, UA: NEGATIVE mg/dL
Hgb urine dipstick: NEGATIVE
Ketones, ur: NEGATIVE mg/dL
Nitrite: POSITIVE — AB
Specific Gravity, Urine: 1.015 (ref 1.005–1.030)
pH: 7 (ref 5.0–8.0)

## 2012-12-26 MED ORDER — DIAZEPAM 5 MG PO TABS
5.0000 mg | ORAL_TABLET | Freq: Once | ORAL | Status: AC
  Administered 2012-12-26: 5 mg via ORAL
  Filled 2012-12-26: qty 1

## 2012-12-26 MED ORDER — DEXTROSE 5 % IV SOLN
1.0000 g | Freq: Once | INTRAVENOUS | Status: AC
  Administered 2012-12-26: 1 g via INTRAVENOUS
  Filled 2012-12-26: qty 10

## 2012-12-26 MED ORDER — HYDROCODONE-ACETAMINOPHEN 5-325 MG PO TABS: 2.0000 | ORAL_TABLET | ORAL | Status: DC | PRN

## 2012-12-26 MED ORDER — KETOROLAC TROMETHAMINE 60 MG/2ML IM SOLN
60.0000 mg | Freq: Once | INTRAMUSCULAR | Status: AC
  Administered 2012-12-26: 60 mg via INTRAMUSCULAR
  Filled 2012-12-26: qty 2

## 2012-12-26 MED ORDER — CIPROFLOXACIN HCL 500 MG PO TABS: 500.0000 mg | ORAL_TABLET | Freq: Every day | ORAL | Status: DC

## 2012-12-26 MED ORDER — CIPROFLOXACIN HCL 500 MG PO TABS
750.0000 mg | ORAL_TABLET | Freq: Once | ORAL | Status: AC
  Administered 2012-12-26: 750 mg via ORAL
  Filled 2012-12-26: qty 2

## 2012-12-26 NOTE — ED Notes (Addendum)
PT states that around 2200 yesterday she began experiencing central chest and back pain.  Pt states that she was experiencing  SOB, diaphoresis, and nausea.  She now denies these symptoms.   Pt states that she does have pain on inspiration.  Pt states that at the time she was lying in bed when pain occurred.  She states no aggravating factors.  Pt states that she has not taken any medication for pain relief. Pt states that around 7 months ago she felt this same type of pain and was treated here at [facility].  Pt unsure of what diagnosis was at that time.  She states that she thinks it may have been a UTI.

## 2012-12-26 NOTE — ED Notes (Signed)
Pt returned from X-ray.  

## 2012-12-26 NOTE — ED Provider Notes (Signed)
History     CSN: [id number]  Arrival date & time 12/26/12  0600   First MD Initiated Contact with Patient 12/26/12 [phone]      Chief Complaint  Patient presents with  . Chest Pain    (Consider location/radiation/quality/duration/timing/severity/associated sxs/prior treatment) HPI Comments: Patient is a 22 year old female who presents with upper back pain since last night. Patient reports gradual onset and progressive worsening of the pain. The pain is located in her upper middle back and does not radiate. The pain is aching and sharp and severe. Inspiration makes the pain worse. Nothing makes the pain better. Patient has tried rubbing her back which does not help. No associated symptoms. Patient reports having this same pain before and was diagnosed with a UTI.    History reviewed. No pertinent past medical history.  Past Surgical History  Procedure Date  . Induced abortion     No family history on file.  History  Substance Use Topics  . Smoking status: Current Every Day Smoker    Types: Cigarettes  . Smokeless tobacco: Not on file  . Alcohol Use: No    OB History    Grav Para Term Preterm Abortions TAB SAB Ect Mult Living                  Review of Systems  Musculoskeletal: Positive for back pain.  All other systems reviewed and are negative.    Allergies  Review of patient's allergies indicates no known allergies.  Home Medications   Current Outpatient Rx  Name  Route  Sig  Dispense  Refill  . IBUPROFEN 200 MG PO TABS   Oral   Take 200 mg by mouth every 6 (six) hours as needed. For pain           BP 123/67  Pulse 88  Temp 97.7 F (36.5 C)  Resp 24  SpO2 100%  Physical Exam  Nursing note and vitals reviewed. Constitutional: She is oriented to person, place, and time. She appears well-developed and well-nourished. No distress.  HENT:  Head: Normocephalic and atraumatic.  Eyes: Conjunctivae normal and EOM are normal.  Neck: Normal range of  motion. Neck supple.  Cardiovascular: Normal rate and regular rhythm.  Exam reveals no gallop and no friction rub.   No murmur heard. Pulmonary/Chest: Effort normal and breath sounds normal. She has no wheezes. She has no rales. She exhibits no tenderness.  Abdominal: Soft. She exhibits no distension. There is no tenderness. There is no rebound and no guarding.  Genitourinary:       No CVA tenderness.   Musculoskeletal: Normal range of motion.       Back is non tender to palpation. Pain is not reproducible to palpation.   Neurological: She is alert and oriented to person, place, and time. Coordination normal.       Speech is goal-oriented. Moves limbs without ataxia.   Skin: Skin is warm and dry.  Psychiatric: She has a normal mood and affect. Her behavior is normal.    ED Course  Procedures (including critical care time)   Date: 12/26/2012  Rate: 83  Rhythm: normal sinus rhythm  QRS Axis: left  Intervals: normal  ST/T Wave abnormalities: nonspecific ST changes  Conduction Disutrbances:none  Narrative Interpretation: NSR without previous for comparison  Old EKG Reviewed: none available    Labs Reviewed  CBC WITH DIFFERENTIAL - Abnormal; Notable for the following:    WBC 15.0 (*)     MCV  77.4 (*)     Neutrophils Relative 85 (*)     Neutro Abs 12.7 (*)     Lymphocytes Relative 9 (*)     All other components within normal limits  URINALYSIS, ROUTINE W REFLEX MICROSCOPIC - Abnormal; Notable for the following:    APPearance CLOUDY (*)     Nitrite POSITIVE (*)     Leukocytes, UA SMALL (*)     All other components within normal limits  URINE MICROSCOPIC-ADD ON - Abnormal; Notable for the following:    Bacteria, UA MANY (*)     All other components within normal limits  BASIC METABOLIC PANEL   Dg Chest 2 View  12/26/2012  *RADIOLOGY REPORT*  Clinical Data:  Chest pain.  CHEST - 2 VIEW  Comparison: 08/05/2008  Findings: The heart size and mediastinal contours are within normal  limits.  Both lungs are clear.  The visualized skeletal structures are unremarkable.  IMPRESSION: No active disease.   Original Report Authenticated By: Irish Lack, M.D.      1. UTI (urinary tract infection)       MDM  6:48 AM Labs pending. Patient will have toradol and valium for pain. Patient is PERC negative.   10:55 AM Labs show elevated WBC. Urinalysis consistent with UTI. Patient will have IV Rocephin here and be discharged with Cipro and pain medication PO. Patient afebrile and non toxic appearing.      Emilia Beck, PA-C 12/26/12 1209

## 2012-12-26 NOTE — ED Notes (Signed)
Pt states she has no pain at rest, only c/o mild pain when she sits up.

## 2012-12-28 LAB — URINE CULTURE

## 2012-12-29 NOTE — ED Provider Notes (Signed)
Medical screening examination/treatment/procedure(s) were performed by non-physician practitioner and as supervising physician I was immediately available for consultation/collaboration.   Gilda Crease, MD 12/29/12 [phone]

## 2012-12-29 NOTE — ED Notes (Signed)
+  Urine. Patient treated with Cipro. Sensitive to same. Per protocol MD. °

## 2013-01-12 ENCOUNTER — Emergency Department (HOSPITAL_COMMUNITY)
Admission: EM | Admit: 2013-01-12 | Discharge: 2013-01-13 | Disposition: A | Discharge status: Home / Self Care | Payer: Self-pay | Attending: Emergency Medicine | Admitting: Emergency Medicine

## 2013-01-12 ENCOUNTER — Encounter (HOSPITAL_COMMUNITY): Payer: Self-pay | Admitting: Emergency Medicine

## 2013-01-12 ENCOUNTER — Emergency Department (HOSPITAL_COMMUNITY): Payer: Self-pay

## 2013-01-12 DIAGNOSIS — F17200 Nicotine dependence, unspecified, uncomplicated: Secondary | ICD-10-CM | POA: Insufficient documentation

## 2013-01-12 DIAGNOSIS — Z87440 Personal history of urinary (tract) infections: Secondary | ICD-10-CM | POA: Insufficient documentation

## 2013-01-12 DIAGNOSIS — IMO0001 Reserved for inherently not codable concepts without codable children: Secondary | ICD-10-CM | POA: Insufficient documentation

## 2013-01-12 DIAGNOSIS — R059 Cough, unspecified: Secondary | ICD-10-CM | POA: Insufficient documentation

## 2013-01-12 DIAGNOSIS — M7918 Myalgia, other site: Secondary | ICD-10-CM

## 2013-01-12 DIAGNOSIS — R05 Cough: Secondary | ICD-10-CM | POA: Insufficient documentation

## 2013-01-12 DIAGNOSIS — R071 Chest pain on breathing: Secondary | ICD-10-CM | POA: Insufficient documentation

## 2013-01-12 LAB — URINALYSIS, ROUTINE W REFLEX MICROSCOPIC
Glucose, UA: NEGATIVE mg/dL
Hgb urine dipstick: NEGATIVE
Specific Gravity, Urine: 1.016 (ref 1.005–1.030)

## 2013-01-12 LAB — BASIC METABOLIC PANEL
BUN: 12 mg/dL (ref 6–23)
CO2: 21 mEq/L (ref 19–32)
Calcium: 9.3 mg/dL (ref 8.4–10.5)
Glucose, Bld: 157 mg/dL — ABNORMAL HIGH (ref 70–99)
Potassium: 3.2 mEq/L — ABNORMAL LOW (ref 3.5–5.1)
Sodium: 138 mEq/L (ref 135–145)

## 2013-01-12 LAB — POCT PREGNANCY, URINE: Preg Test, Ur: NEGATIVE

## 2013-01-12 LAB — CBC WITH DIFFERENTIAL/PLATELET
Basophils Relative: 0 % (ref 0–1)
Eosinophils Relative: 2 % (ref 0–5)
HCT: 38.7 % (ref 36.0–46.0)
Hemoglobin: 13.7 g/dL (ref 12.0–15.0)
MCH: 26.9 pg (ref 26.0–34.0)
Monocytes Absolute: 1.7 10*3/uL — ABNORMAL HIGH (ref 0.1–1.0)
Neutro Abs: 6.3 10*3/uL (ref 1.7–7.7)
Neutrophils Relative %: 45 % (ref 43–77)
RBC: 5.09 MIL/uL (ref 3.87–5.11)

## 2013-01-12 LAB — URINE MICROSCOPIC-ADD ON

## 2013-01-12 MED ORDER — POTASSIUM CHLORIDE CRYS ER 20 MEQ PO TBCR
40.0000 meq | EXTENDED_RELEASE_TABLET | Freq: Once | ORAL | Status: AC
  Administered 2013-01-12: 40 meq via ORAL
  Filled 2013-01-12: qty 2

## 2013-01-12 MED ORDER — SODIUM CHLORIDE 0.9 % IV SOLN
INTRAVENOUS | Status: DC
  Administered 2013-01-12: 1000 mL via INTRAVENOUS

## 2013-01-12 MED ORDER — IBUPROFEN 800 MG PO TABS
800.0000 mg | ORAL_TABLET | Freq: Once | ORAL | Status: AC
  Administered 2013-01-12: 800 mg via ORAL
  Filled 2013-01-12: qty 1

## 2013-01-12 MED ORDER — ONDANSETRON 4 MG PO TBDP
4.0000 mg | ORAL_TABLET | Freq: Once | ORAL | Status: AC
  Administered 2013-01-12: 4 mg via ORAL
  Filled 2013-01-12: qty 1

## 2013-01-12 MED ORDER — ACETAMINOPHEN 325 MG PO TABS
650.0000 mg | ORAL_TABLET | Freq: Once | ORAL | Status: AC
  Administered 2013-01-12: 650 mg via ORAL

## 2013-01-12 MED ORDER — KETOROLAC TROMETHAMINE 30 MG/ML IJ SOLN
30.0000 mg | Freq: Once | INTRAMUSCULAR | Status: AC
  Administered 2013-01-12: 30 mg via INTRAVENOUS
  Filled 2013-01-12: qty 1

## 2013-01-12 MED ORDER — SODIUM CHLORIDE 0.9 % IV BOLUS (SEPSIS)
1000.0000 mL | Freq: Once | INTRAVENOUS | Status: AC
  Administered 2013-01-12: 1000 mL via INTRAVENOUS

## 2013-01-12 MED ORDER — ONDANSETRON HCL 4 MG/2ML IJ SOLN
4.0000 mg | Freq: Once | INTRAMUSCULAR | Status: AC
  Administered 2013-01-12: 4 mg via INTRAVENOUS
  Filled 2013-01-12: qty 2

## 2013-01-12 MED ORDER — NAPROXEN 500 MG PO TABS: 500.0000 mg | ORAL_TABLET | Freq: Two times a day (BID) | ORAL | Status: DC

## 2013-01-12 MED ORDER — MORPHINE SULFATE 2 MG/ML IJ SOLN
4.0000 mg | Freq: Once | INTRAMUSCULAR | Status: DC
  Filled 2013-01-12: qty 2

## 2013-01-12 NOTE — ED Notes (Signed)
Pt sts hx of same when had UTI

## 2013-01-12 NOTE — ED Provider Notes (Signed)
History     CSN: [id number]  Arrival date & time 01/12/13  1737   First MD Initiated Contact with Patient 01/12/13 1947      Chief Complaint  Patient presents with  . Back Pain  . Chest Pain    (Consider location/radiation/quality/duration/timing/severity/associated sxs/prior treatment) Patient is a 22 y.o. female presenting with back pain and chest pain. The history is provided by the patient. No language interpreter was used.  Back Pain Pain location: cva  Quality: sharp. Radiates to: L flank. Pain severity:  Moderate Onset quality:  Gradual Duration:  10 hours Progression:  Worsening Worsened by:  Coughing, bending, palpation and touching Ineffective treatments:  None tried Associated symptoms: chest pain   Chest Pain Associated symptoms: back pain   21yo with c/o L cva tenderness and L chest pain below L breast.  States that she started having the pain around 11am at work while standing. Also c/o cough that started at work when there was smoke in the kitchen from cooking.  Recent pyelonephritis.  Denies fever, dysuria, SOB,. Vaginal discharge, hematurnia.  Also states that she has been to the gym working out.  pmh UTI, Smoker. No long trips, calf pain or bcp.  History reviewed. No pertinent past medical history.  Past Surgical History  Procedure Laterality Date  . Induced abortion      History reviewed. No pertinent family history.  History  Substance Use Topics  . Smoking status: Current Every Day Smoker    Types: Cigarettes  . Smokeless tobacco: Not on file  . Alcohol Use: No    OB History   Grav Para Term Preterm Abortions TAB SAB Ect Mult Living                  Review of Systems  Cardiovascular: Positive for chest pain.  Musculoskeletal: Positive for back pain.    Allergies  Review of patient's allergies indicates no known allergies.  Home Medications   Current Outpatient Rx  Name  Route  Sig  Dispense  Refill  . ibuprofen (ADVIL,MOTRIN) 200  MG tablet   Oral   Take 200 mg by mouth every 6 (six) hours as needed. For pain           BP 123/95  Pulse 113  Temp(Src) 97.4 F (36.3 C) (Oral)  Resp 24  SpO2 98%  LMP 12/15/2012  Physical Exam  Nursing note and vitals reviewed. Constitutional: She is oriented to person, place, and time. She appears well-developed and well-nourished.  HENT:  Head: Normocephalic and atraumatic.  Eyes: Conjunctivae and EOM are normal. Pupils are equal, round, and reactive to light.  Neck: Normal range of motion. Neck supple.  Cardiovascular: Normal rate.   Pulmonary/Chest: Effort normal and breath sounds normal. No respiratory distress. She has no wheezes. She exhibits tenderness.  Abdominal: Soft.  Musculoskeletal: Normal range of motion. She exhibits tenderness. She exhibits no edema.  Tender to L posterior ribs and anterior ribs below L breast.    Neurological: She is alert and oriented to person, place, and time. She has normal reflexes.  Skin: Skin is warm and dry.  Psychiatric: She has a normal mood and affect.    ED Course  Procedures (including critical care time)  Labs Reviewed  CBC WITH DIFFERENTIAL - Abnormal; Notable for the following:    WBC 14.1 (*)    MCV 76.0 (*)    Lymphs Abs 5.8 (*)    Monocytes Absolute 1.7 (*)    All other  components within normal limits  BASIC METABOLIC PANEL - Abnormal; Notable for the following:    Potassium 3.2 (*)    Glucose, Bld 157 (*)    All other components within normal limits  URINALYSIS, ROUTINE W REFLEX MICROSCOPIC   Dg Chest 2 View  01/12/2013  *RADIOLOGY REPORT*  Clinical Data: Fever.  Back and chest pain.  CHEST - 2 VIEW  Comparison: 12/26/2012  Findings: Density over the right chest is compatible to patient's hair.  Linear opacity left lung base favor subsegmental atelectasis. Low lung volumes are present, causing crowding of the pulmonary vasculature.  Cardiac and mediastinal contours appear unremarkable.  No pleural effusion  noted.  IMPRESSION: 1.  Linear opacity left lung base favors subsegmental atelectasis.  2.  Low lung volumes.   Original Report Authenticated By: Gaylyn Rong, M.D.      No diagnosis found.    MDM  Chest pain/ back pain with cough.  Muscularskeletal pain.  Patient advised to return with worsening cough, n/v or fever.  Chest x-ray shows opacity/atelectisis to LLL.  WBC 14.1. Hypokalemia 3.2. Received kcl 40 meq in the er.  Shared visit with Dr. Hyacinth Meeker. Better after toradol in the ER.  rx for naproxyn.  [place] ACS.  May be early pneumonia.  Labs Reviewed  CBC WITH DIFFERENTIAL - Abnormal; Notable for the following:    WBC 14.1 (*)    MCV 76.0 (*)    Lymphs Abs 5.8 (*)    Monocytes Absolute 1.7 (*)    All other components within normal limits  BASIC METABOLIC PANEL - Abnormal; Notable for the following:    Potassium 3.2 (*)    Glucose, Bld 157 (*)    All other components within normal limits  URINALYSIS, ROUTINE W REFLEX MICROSCOPIC - Abnormal; Notable for the following:    APPearance CLOUDY (*)    Leukocytes, UA SMALL (*)    All other components within normal limits  URINE MICROSCOPIC-ADD ON - Abnormal; Notable for the following:    Squamous Epithelial / LPF MANY (*)    All other components within normal limits  URINE CULTURE  POCT PREGNANCY, URINE          Remi Haggard, NP 01/13/13 [phone]

## 2013-01-12 NOTE — ED Notes (Addendum)
Pt c/o mid back pain around to chest starting today; pt denies injury; pt sts painful to take deep breath

## 2013-01-13 NOTE — ED Provider Notes (Signed)
Pt presents with back pain starting earlier in the day, has now having pain in the L anterior chest and states is worse when she changes position in the bed, raising arm and palpating chest and back.  Deneis fevers, swelling of legs and has no travel or immobilization, trauma, swelling, OCP use, tobacco use, prior DVT and is not tachycardic on exam.  On my exam she has no swelling or asymetry of the legs and has normal lung and heart sounds.  She has reproducible ttp over the L upper back and the L anterior chest wall under the breast which the pt states is exactly the same as her pain.  Pt with unlikely PE, unlikely ACS, likely chest wall pain, stable for d/c on NSAIDs.  Medical screening examination/treatment/procedure(s) were conducted as a shared visit with non-physician practitioner(s) and myself.  I personally evaluated the patient during the encounter    Vida Roller, MD 01/13/13 [phone]

## 2013-01-14 LAB — URINE CULTURE

## 2013-02-14 ENCOUNTER — Encounter (HOSPITAL_COMMUNITY): Payer: Self-pay | Admitting: *Deleted

## 2013-02-14 ENCOUNTER — Emergency Department (HOSPITAL_COMMUNITY): Payer: Self-pay

## 2013-02-14 ENCOUNTER — Emergency Department (HOSPITAL_COMMUNITY)
Admission: EM | Admit: 2013-02-14 | Discharge: 2013-02-14 | Disposition: A | Discharge status: Home / Self Care | Payer: Self-pay | Attending: Emergency Medicine | Admitting: Emergency Medicine

## 2013-02-14 DIAGNOSIS — K829 Disease of gallbladder, unspecified: Secondary | ICD-10-CM

## 2013-02-14 DIAGNOSIS — R6883 Chills (without fever): Secondary | ICD-10-CM | POA: Insufficient documentation

## 2013-02-14 DIAGNOSIS — R7989 Other specified abnormal findings of blood chemistry: Secondary | ICD-10-CM | POA: Insufficient documentation

## 2013-02-14 DIAGNOSIS — R61 Generalized hyperhidrosis: Secondary | ICD-10-CM | POA: Insufficient documentation

## 2013-02-14 DIAGNOSIS — Z3202 Encounter for pregnancy test, result negative: Secondary | ICD-10-CM | POA: Insufficient documentation

## 2013-02-14 DIAGNOSIS — K828 Other specified diseases of gallbladder: Secondary | ICD-10-CM | POA: Insufficient documentation

## 2013-02-14 DIAGNOSIS — F17200 Nicotine dependence, unspecified, uncomplicated: Secondary | ICD-10-CM | POA: Insufficient documentation

## 2013-02-14 DIAGNOSIS — R109 Unspecified abdominal pain: Secondary | ICD-10-CM | POA: Insufficient documentation

## 2013-02-14 DIAGNOSIS — R112 Nausea with vomiting, unspecified: Secondary | ICD-10-CM | POA: Insufficient documentation

## 2013-02-14 LAB — COMPREHENSIVE METABOLIC PANEL
ALT: 161 U/L — ABNORMAL HIGH (ref 0–35)
AST: 219 U/L — ABNORMAL HIGH (ref 0–37)
Alkaline Phosphatase: 56 U/L (ref 39–117)
CO2: 26 mEq/L (ref 19–32)
Chloride: 102 mEq/L (ref 96–112)
Creatinine, Ser: 0.66 mg/dL (ref 0.50–1.10)
GFR calc non Af Amer: >90 mL/min (ref >90)
Total Bilirubin: 0.7 mg/dL (ref 0.3–1.2)

## 2013-02-14 LAB — URINALYSIS, ROUTINE W REFLEX MICROSCOPIC
Bilirubin Urine: NEGATIVE
Glucose, UA: NEGATIVE mg/dL
Hgb urine dipstick: NEGATIVE
Ketones, ur: NEGATIVE mg/dL
Protein, ur: NEGATIVE mg/dL

## 2013-02-14 LAB — CBC WITH DIFFERENTIAL/PLATELET
Basophils Absolute: 0 10*3/uL (ref 0.0–0.1)
HCT: 40.8 % (ref 36.0–46.0)
Hemoglobin: 14.5 g/dL (ref 12.0–15.0)
Lymphocytes Relative: 5 % — ABNORMAL LOW (ref 12–46)
Monocytes Absolute: 1.4 10*3/uL — ABNORMAL HIGH (ref 0.1–1.0)
Neutro Abs: 14.8 10*3/uL — ABNORMAL HIGH (ref 1.7–7.7)
RDW: 14.4 % (ref 11.5–15.5)
WBC: 17 10*3/uL — ABNORMAL HIGH (ref 4.0–10.5)

## 2013-02-14 LAB — URINE MICROSCOPIC-ADD ON

## 2013-02-14 MED ORDER — ONDANSETRON 4 MG PO TBDP
8.0000 mg | ORAL_TABLET | Freq: Once | ORAL | Status: AC
  Administered 2013-02-14: 8 mg via ORAL
  Filled 2013-02-14: qty 2

## 2013-02-14 MED ORDER — OXYCODONE-ACETAMINOPHEN 5-325 MG PO TABS: 1.0000 | ORAL_TABLET | Freq: Four times a day (QID) | ORAL | Status: DC | PRN

## 2013-02-14 MED ORDER — KETOROLAC TROMETHAMINE 60 MG/2ML IM SOLN
60.0000 mg | Freq: Once | INTRAMUSCULAR | Status: AC
  Administered 2013-02-14: 60 mg via INTRAMUSCULAR
  Filled 2013-02-14: qty 2

## 2013-02-14 MED ORDER — ONDANSETRON HCL 4 MG PO TABS: 4.0000 mg | ORAL_TABLET | Freq: Four times a day (QID) | ORAL | Status: DC

## 2013-02-14 NOTE — ED Notes (Addendum)
C/o upper mid back pain and nv, also CP, CP worse with & r/t vomiting. Also sweating and cold (currently vomiting). (Denies: known fever, sob, cough congestion, cold sx, urinary or vaginal sx or diarrhea), back pain sudden onset, onset at 0100.  Took 2 advil PTA. Was admitted to hospital 2 weeks ago for the same thing, "not sure what it is". Have vomited x3 tonight.

## 2013-02-14 NOTE — ED Provider Notes (Signed)
History     CSN: [id number]  Arrival date & time 02/14/13  1478   First MD Initiated Contact with Patient 02/14/13 [phone]      Chief Complaint  Patient presents with  . Back Pain    upper back    (Consider location/radiation/quality/duration/timing/severity/associated sxs/prior treatment) HPI Comments: 22 year old female presents emergency department complaining of right-sided upper mid back pain dating to her abdomen waking her from sleep 5 hours ago. States when she woke up she had a sharp pain in her back rated 8/10 radiating to her right upper quadrant and midepigastric area. She then became nauseous and vomited once with associated diaphoresis. After vomiting her abdomen began to feel little bit better. Back pain still present, worse when moving side to side. Admits to associated chills, unsure fever. She was in the emergency department on February 2 and diagnosed with musculoskeletal pain, however at that time the left lower lobe opacity was seen on chest x-ray. She was given a prescription for naproxen but no antibiotic. Admits to family history of gallstones. Last night she ate a beef patty and noodles which her boyfriend also ate and is feeling fine. Denies any urinary or vaginal complaints.  Patient is a 22 y.o. female presenting with back pain. The history is provided by the patient.  Back Pain Associated symptoms: abdominal pain   Associated symptoms: no chest pain, no dysuria, no fever, no headaches and no weakness     History reviewed. No pertinent past medical history.  Past Surgical History  Procedure Laterality Date  . Induced abortion      No family history on file.  History  Substance Use Topics  . Smoking status: Current Every Day Smoker    Types: Cigarettes  . Smokeless tobacco: Not on file  . Alcohol Use: No    OB History   Grav Para Term Preterm Abortions TAB SAB Ect Mult Living                  Review of Systems  Constitutional: Positive for  chills and diaphoresis. Negative for fever.  HENT: Negative for neck pain and neck stiffness.   Respiratory: Negative for shortness of breath.   Cardiovascular: Negative for chest pain.  Gastrointestinal: Positive for nausea, vomiting and abdominal pain. Negative for diarrhea.  Genitourinary: Negative for dysuria, urgency, frequency, hematuria, flank pain, vaginal bleeding, vaginal discharge, difficulty urinating, vaginal pain and menstrual problem.  Musculoskeletal: Positive for back pain.  Skin: Negative for rash.  Neurological: Negative for dizziness, weakness, light-headedness and headaches.  All other systems reviewed and are negative.    Allergies  Review of patient's allergies indicates no known allergies.  Home Medications   Current Outpatient Rx  Name  Route  Sig  Dispense  Refill  . ibuprofen (ADVIL,MOTRIN) 200 MG tablet   Oral   Take 200 mg by mouth every 6 (six) hours as needed. For pain           BP 123/79  Pulse 82  Temp(Src) 97.7 F (36.5 C) (Oral)  Resp 22  SpO2 100%  LMP 01/23/2013  Physical Exam  Nursing note and vitals reviewed. Constitutional: She is oriented to person, place, and time. She appears well-developed and well-nourished. No distress.  HENT:  Head: Normocephalic and atraumatic.  Mouth/Throat: Oropharynx is clear and moist.  Eyes: Conjunctivae and EOM are normal.  Neck: Normal range of motion. Neck supple.  Cardiovascular: Normal rate, regular rhythm and normal heart sounds.   Pulmonary/Chest: Effort normal and  breath sounds normal. No respiratory distress.  Abdominal: Soft. Normal appearance and bowel sounds are normal. There is tenderness in the right upper quadrant and epigastric area. There is guarding and positive Murphy's sign. There is no rigidity, no rebound, no CVA tenderness and no tenderness at McBurney's point.  No peritoneal signs.  Musculoskeletal: Normal range of motion. She exhibits no edema.       Back:  Neurological:  She is alert and oriented to person, place, and time.  Skin: Skin is warm and dry. She is not diaphoretic.  Psychiatric: She has a normal mood and affect. Her behavior is normal.    ED Course  Procedures (including critical care time)  Labs Reviewed  CBC WITH DIFFERENTIAL - Abnormal; Notable for the following:    WBC 17.0 (*)    RBC 5.39 (*)    MCV 75.7 (*)    Neutrophils Relative 87 (*)    Neutro Abs 14.8 (*)    Lymphocytes Relative 5 (*)    Monocytes Absolute 1.4 (*)    All other components within normal limits  COMPREHENSIVE METABOLIC PANEL - Abnormal; Notable for the following:    AST 219 (*)    ALT 161 (*)    All other components within normal limits  URINALYSIS, ROUTINE W REFLEX MICROSCOPIC - Abnormal; Notable for the following:    Leukocytes, UA TRACE (*)    All other components within normal limits  URINE MICROSCOPIC-ADD ON - Abnormal; Notable for the following:    Squamous Epithelial / LPF FEW (*)    All other components within normal limits  LIPASE, BLOOD  POCT PREGNANCY, URINE   Dg Chest 2 View  02/14/2013  *RADIOLOGY REPORT*  Clinical Data: Back pain  CHEST - 2 VIEW  Comparison: 01/12/2013  Findings: Normal heart size.  Clear lungs.  IMPRESSION: Negative.   Original Report Authenticated By: Jolaine Click, M.D.    US Abdomen Complete  02/14/2013  *RADIOLOGY REPORT*  Clinical Data:  Right upper quadrant pain  ABDOMINAL ULTRASOUND COMPLETE  Comparison:  10/06/2011.  Findings:  Gallbladder:  There is an oval hyperechoic mobile structure within the dependent portion of the gallbladder without shadowing compatible with a sludge ball versus a nonshadowing calculus.  This is stable.  Wall is upper normal in thickness.  No pericholecystic fluid.  No Murphy's sign.  Common Bile Duct:  Within normal limits in caliber.  Liver: No focal mass lesion identified.  Within normal limits in parenchymal echogenicity.  IVC:  Appears normal.  Pancreas:  No abnormality identified.  Spleen:   Within normal limits in size and echotexture.  Right kidney:  Normal in size and parenchymal echogenicity.  No evidence of mass or hydronephrosis.  Left kidney:  Normal in size and parenchymal echogenicity.  No evidence of mass or hydronephrosis.  Abdominal Aorta:  No aneurysm identified.  IMPRESSION: Stable echogenic structure within the gallbladder representing either a sludge ball or nonshadowing calculus.   Original Report Authenticated By: Jolaine Click, M.D.      1. Gallbladder sludge   2. Gallbladder disease   3. Abdominal pain   4. Elevated LFTs       MDM  22 year old female gallbladder disease. And abdominal ultrasound results as stated above. She has elevated LFTs. She is in no apparent distress is more comfortable after receiving Zofran and Toradol. She will followup with surgery as an outpatient. The Percocet and Zofran given. Return precautions discussed. Patient states understanding of plan and is agreeable.  Trevor Mace, PA-C 02/14/13 [phone]

## 2013-02-14 NOTE — ED Notes (Signed)
Patient is here for back and chest pain, pt sts constant since it woke her from sleep at 1 am, reports vomiting and diaphoressis, pt now warm and dry nad noted.

## 2013-02-16 NOTE — ED Provider Notes (Signed)
Medical screening examination/treatment/procedure(s) were performed by non-physician practitioner and as supervising physician I was immediately available for consultation/collaboration.   Hanley Seamen, MD 02/16/13 2250

## 2013-02-23 ENCOUNTER — Ambulatory Visit (INDEPENDENT_AMBULATORY_CARE_PROVIDER_SITE_OTHER): Payer: Self-pay | Admitting: Surgery

## 2013-02-23 DIAGNOSIS — K829 Disease of gallbladder, unspecified: Secondary | ICD-10-CM

## 2013-02-23 NOTE — Progress Notes (Signed)
Patient ID: Gina Bruce, female   DOB: 03-16-1991, 22 y.o.   MRN: [id number]  Pt was a no show

## 2013-03-02 ENCOUNTER — Encounter (INDEPENDENT_AMBULATORY_CARE_PROVIDER_SITE_OTHER): Payer: Self-pay | Admitting: Surgery

## 2013-03-27 ENCOUNTER — Encounter (HOSPITAL_COMMUNITY): Payer: Self-pay

## 2013-03-27 ENCOUNTER — Inpatient Hospital Stay (HOSPITAL_COMMUNITY)
Admission: AD | Admit: 2013-03-27 | Discharge: 2013-03-27 | Disposition: A | Discharge status: Home / Self Care | Payer: Self-pay | Source: Ambulatory Visit | Attending: Obstetrics & Gynecology | Admitting: Obstetrics & Gynecology

## 2013-03-27 ENCOUNTER — Inpatient Hospital Stay (HOSPITAL_COMMUNITY): Payer: Self-pay

## 2013-03-27 DIAGNOSIS — O00109 Unspecified tubal pregnancy without intrauterine pregnancy: Secondary | ICD-10-CM | POA: Insufficient documentation

## 2013-03-27 DIAGNOSIS — O009 Unspecified ectopic pregnancy without intrauterine pregnancy: Secondary | ICD-10-CM

## 2013-03-27 DIAGNOSIS — R109 Unspecified abdominal pain: Secondary | ICD-10-CM | POA: Insufficient documentation

## 2013-03-27 LAB — WET PREP, GENITAL: Yeast Wet Prep HPF POC: NONE SEEN

## 2013-03-27 LAB — CBC
HCT: 38 % (ref 36.0–46.0)
Hemoglobin: 13.1 g/dL (ref 12.0–15.0)
MCV: 77.2 fL — ABNORMAL LOW (ref 78.0–100.0)
WBC: 8.2 10*3/uL (ref 4.0–10.5)

## 2013-03-27 LAB — URINALYSIS, ROUTINE W REFLEX MICROSCOPIC
Nitrite: NEGATIVE
Specific Gravity, Urine: 1.015 (ref 1.005–1.030)
Urobilinogen, UA: 2 mg/dL — ABNORMAL HIGH (ref 0.0–1.0)
pH: 6 (ref 5.0–8.0)

## 2013-03-27 LAB — URINE MICROSCOPIC-ADD ON

## 2013-03-27 LAB — ABO/RH: ABO/RH(D): A POS

## 2013-03-27 LAB — CREATININE, SERUM: GFR calc Af Amer: >90 mL/min (ref >90)

## 2013-03-27 LAB — POCT PREGNANCY, URINE: Preg Test, Ur: POSITIVE — AB

## 2013-03-27 MED ORDER — METHOTREXATE INJECTION FOR WOMEN'S HOSPITAL
50.0000 mg/m2 | Freq: Once | INTRAMUSCULAR | Status: AC
  Administered 2013-03-27: 95 mg via INTRAMUSCULAR
  Filled 2013-03-27: qty 1.9

## 2013-03-27 NOTE — MAU Note (Signed)
Comfort care provided -packet given and heart-pt verbalizes appreciation

## 2013-03-27 NOTE — MAU Note (Signed)
Patient states she has had a positive pregnancy test at [facility] the firs of April. States she started bleeding last night like a period and changed 2 pads in one hour last night. States less bleeding today and having slight cramping.

## 2013-03-27 NOTE — MAU Provider Note (Signed)
History     CSN: [id number]  Arrival date and time: 03/27/13 1343   First Provider Initiated Contact with Patient 03/27/13 1531      Chief Complaint  Patient presents with  . Possible Pregnancy  . Vaginal Bleeding  . Abdominal Cramping   HPI [name] is a 22 y.o. G3P1011 at 106w2d who presents to MAU today with complaint of vaginal bleeding and abdominal pain. The patient states that she had heavy bleeding last night. Soaked through 2 pads in 1 hour, but it has become significantly lighter since then. She had severe cramping last night, which has also improved today. She had some nausea and one episode of vomiting this morning. LMP was 02/18/13. She denies fever, UTI symptoms or discharge.    OB History   Grav Para Term Preterm Abortions TAB SAB Ect Mult Living   3 1 1  1     1       Past Medical History  Diagnosis Date  . Medical history non-contributory     Past Surgical History  Procedure Laterality Date  . Induced abortion    . Cesarean section      History reviewed. No pertinent family history.  History  Substance Use Topics  . Smoking status: Never Smoker   . Smokeless tobacco: Not on file  . Alcohol Use: No    Allergies: No Known Allergies  No prescriptions prior to admission    Review of Systems  Constitutional: Negative for fever, chills and malaise/fatigue.  Gastrointestinal: Positive for nausea, vomiting and abdominal pain.  Genitourinary: Negative for dysuria, urgency and frequency.       + vaginal bleeding Neg - vaginal discharge  Neurological: Negative for dizziness, loss of consciousness and weakness.   Physical Exam   Blood pressure 116/65, pulse 108, temperature 98.6 F (37 C), temperature source Oral, resp. rate 16, height 5\' 2"  (1.575 m), weight 174 lb (78.926 kg), last menstrual period 02/18/2013, SpO2 100.00%.  Physical Exam  Constitutional: She is oriented to person, place, and time. She appears well-developed and  well-nourished. No distress.  HENT:  Head: Normocephalic and atraumatic.  Cardiovascular: Normal rate, regular rhythm and normal heart sounds.   Respiratory: Effort normal and breath sounds normal. No respiratory distress.  GI: Soft. Bowel sounds are normal. She exhibits no distension and no mass. There is tenderness (lower abdominal tenderness to palpation). There is no rebound and no guarding.  Genitourinary: Vagina normal. Uterus is not enlarged and not tender. Cervix exhibits discharge (small amount of blood in the vaginal vault). Cervix exhibits no motion tenderness and no friability. Right adnexum displays no mass and no tenderness. Left adnexum displays no mass and no tenderness.  Neurological: She is alert and oriented to person, place, and time.  Skin: Skin is warm and dry. No erythema.  Psychiatric: She has a normal mood and affect.   Results for orders placed during the hospital encounter of 03/27/13 (from the past 24 hour(s))  URINALYSIS, ROUTINE W REFLEX MICROSCOPIC     Status: Abnormal   Collection Time    03/27/13  2:15 PM      Result Value Range   Color, Urine YELLOW  YELLOW   APPearance CLEAR  CLEAR   Specific Gravity, Urine 1.015  1.005 - 1.030   pH 6.0  5.0 - 8.0   Glucose, UA NEGATIVE  NEGATIVE mg/dL   Hgb urine dipstick LARGE (*) NEGATIVE   Bilirubin Urine NEGATIVE  NEGATIVE   Ketones, ur NEGATIVE  NEGATIVE mg/dL   Protein, ur NEGATIVE  NEGATIVE mg/dL   Urobilinogen, UA 2.0 (*) 0.0 - 1.0 mg/dL   Nitrite NEGATIVE  NEGATIVE   Leukocytes, UA TRACE (*) NEGATIVE  URINE MICROSCOPIC-ADD ON     Status: Abnormal   Collection Time    03/27/13  2:15 PM      Result Value Range   Squamous Epithelial / LPF FEW (*) RARE   WBC, UA 3-6  <3 WBC/hpf  POCT PREGNANCY, URINE     Status: Abnormal   Collection Time    03/27/13  2:18 PM      Result Value Range   Preg Test, Ur POSITIVE (*) NEGATIVE  AST     Status: None   Collection Time    03/27/13  2:35 PM      Result Value  Range   AST 18  0 - 37 U/L  BUN     Status: None   Collection Time    03/27/13  2:35 PM      Result Value Range   BUN 8  6 - 23 mg/dL  CREATININE, SERUM     Status: None   Collection Time    03/27/13  2:35 PM      Result Value Range   Creatinine, Ser 0.72  0.50 - 1.10 mg/dL   GFR calc non Af Amer >90  >90 mL/min   GFR calc Af Amer >90  >90 mL/min  CBC     Status: Abnormal   Collection Time    03/27/13  2:36 PM      Result Value Range   WBC 8.2  4.0 - 10.5 K/uL   RBC 4.92  3.87 - 5.11 MIL/uL   Hemoglobin 13.1  12.0 - 15.0 g/dL   HCT 96.0  45.4 - 09.8 %   MCV 77.2 (*) 78.0 - 100.0 fL   MCH 26.6  26.0 - 34.0 pg   MCHC 34.5  30.0 - 36.0 g/dL   RDW 11.9  14.7 - 82.9 %   Platelets 307  150 - 400 K/uL  HCG, QUANTITATIVE, PREGNANCY     Status: Abnormal   Collection Time    03/27/13  2:36 PM      Result Value Range   hCG, Beta Chain, Quant, S 853 (*) <5 mIU/mL  ABO/RH     Status: None   Collection Time    03/27/13  2:36 PM      Result Value Range   ABO/RH(D) A POS    WET PREP, GENITAL     Status: Abnormal   Collection Time    03/27/13  3:45 PM      Result Value Range   Yeast Wet Prep HPF POC NONE SEEN  NONE SEEN   Trich, Wet Prep NONE SEEN  NONE SEEN   Clue Cells Wet Prep HPF POC FEW (*) NONE SEEN   WBC, Wet Prep HPF POC FEW (*) NONE SEEN    MAU Course  Procedures None  MDM Discussed with Dr. Despina Hidden. Patient is a candidate for MTX. Return for quant hCG on day 4 and 7  Assessment and Plan  A: Left ectopic pregnancy  P: Discharge home MTX given in MAU today Patient will follow-up for labs on Monday (day 4) and Thursday (day 7) of next week Bleeding precautions discussed Patient may return to MAU sooner PRN  Freddi Starr, PA-C  03/27/2013, 6:59 PM

## 2013-03-31 ENCOUNTER — Inpatient Hospital Stay (HOSPITAL_COMMUNITY)
Admission: AD | Admit: 2013-03-31 | Discharge: 2013-03-31 | Disposition: A | Discharge status: Home / Self Care | Payer: Self-pay | Source: Ambulatory Visit | Attending: Obstetrics & Gynecology | Admitting: Obstetrics & Gynecology

## 2013-03-31 DIAGNOSIS — O009 Unspecified ectopic pregnancy without intrauterine pregnancy: Secondary | ICD-10-CM

## 2013-03-31 DIAGNOSIS — O00109 Unspecified tubal pregnancy without intrauterine pregnancy: Secondary | ICD-10-CM | POA: Insufficient documentation

## 2013-03-31 LAB — HCG, QUANTITATIVE, PREGNANCY: hCG, Beta Chain, Quant, S: 533 m[IU]/mL — ABNORMAL HIGH (ref <5)

## 2013-03-31 NOTE — MAU Note (Signed)
Gina Bruce is here for a follow up Beta Hcg. She is not complaining of pain and her bleeding is stable and about the same

## 2013-03-31 NOTE — MAU Provider Note (Signed)
  History     CSN: [id number]  Arrival date and time: 03/31/13 1838   None     No chief complaint on file.  HPI This is a 22 y.o. female at [redacted]w[redacted]d who presents for follow up Quant HCG level. Denies pain or bleeding increase. Last Quant was 853.  Received Methotrexate for a left ectopic on 4/25.    RN Note: Ms. Labate is here for a follow up Beta Hcg. She is not complaining of pain and her bleeding is stable and about the same  OB History   Grav Para Term Preterm Abortions TAB SAB Ect Mult Living   3 1 1  1     1       Past Medical History  Diagnosis Date  . Medical history non-contributory     Past Surgical History  Procedure Laterality Date  . Induced abortion    . Cesarean section      No family history on file.  History  Substance Use Topics  . Smoking status: Never Smoker   . Smokeless tobacco: Not on file  . Alcohol Use: No    Allergies: No Known Allergies  No prescriptions prior to admission    Review of Systems  Constitutional: Negative for fever and malaise/fatigue.  Gastrointestinal: Negative for nausea, vomiting and abdominal pain.  Genitourinary:       Spotting   Neurological: Negative for weakness.   Physical Exam   Last menstrual period 02/18/2013.  Physical Exam  Constitutional: She is oriented to person, place, and time. She appears well-developed and well-nourished. No distress.  HENT:  Head: Normocephalic.  Cardiovascular: Normal rate.   Respiratory: Effort normal.  Neurological: She is alert and oriented to person, place, and time.  Skin: Skin is warm and dry.  Psychiatric: She has a normal mood and affect.    MAU Course  Procedures  MDM Last quant was 853:  15% drop would be 725  >>   Results for orders placed during the hospital encounter of 03/31/13 (from the past 24 hour(s))  HCG, QUANTITATIVE, PREGNANCY     Status: Abnormal   Collection Time    03/31/13  6:57 PM      Result Value Range   hCG, Beta Chain, Quant, S 533  (*) <5 mIU/mL     Assessment and Plan  A:  Left Ectopic pregnancy      Appropriately dropping HCG levels  P:  Return 3 days for Day 7 labs      Ectopic precautions  [facility] 03/31/2013, 6:56 PM

## 2013-03-31 NOTE — MAU Note (Signed)
Pt discharged from Triage per provider Poe,CNM

## 2013-04-02 NOTE — MAU Provider Note (Signed)
Attestation of Attending Supervision of Advanced Practitioner (PA/CNM/NP): Evaluation and management procedures were performed by the Advanced Practitioner under my supervision and collaboration.  I have reviewed the Advanced Practitioner's note and chart, and I agree with the management and plan.  Avyon Herendeen, MD, FACOG Attending Obstetrician & Gynecologist Faculty Practice, [facility]  

## 2013-04-03 ENCOUNTER — Inpatient Hospital Stay (HOSPITAL_COMMUNITY)
Admission: AD | Admit: 2013-04-03 | Discharge: 2013-04-03 | Disposition: A | Discharge status: Home / Self Care | Payer: Self-pay | Source: Ambulatory Visit | Attending: Obstetrics & Gynecology | Admitting: Obstetrics & Gynecology

## 2013-04-03 ENCOUNTER — Encounter (HOSPITAL_COMMUNITY): Payer: Self-pay

## 2013-04-03 DIAGNOSIS — O00109 Unspecified tubal pregnancy without intrauterine pregnancy: Secondary | ICD-10-CM | POA: Insufficient documentation

## 2013-04-03 DIAGNOSIS — Z8742 Personal history of other diseases of the female genital tract: Secondary | ICD-10-CM

## 2013-04-03 DIAGNOSIS — Z8759 Personal history of other complications of pregnancy, childbirth and the puerperium: Secondary | ICD-10-CM

## 2013-04-03 HISTORY — DX: Unspecified ectopic pregnancy without intrauterine pregnancy: O00.90

## 2013-04-03 LAB — HCG, QUANTITATIVE, PREGNANCY: hCG, Beta Chain, Quant, S: 342 m[IU]/mL — ABNORMAL HIGH (ref <5)

## 2013-04-03 NOTE — MAU Note (Signed)
Pt here for f/u bhcg post MTX, denies pain or bleeding.

## 2013-04-03 NOTE — MAU Provider Note (Signed)
  History     CSN: [id number]  Arrival date and time: 04/03/13 1814   None     Chief Complaint  Patient presents with  . Follow-up BHCG post MTX    HPI  Gina Bruce is a 22 y.o. who is s/p MTX on 03/27/13. She is feeling well. Scant bleeding, and no pain at this time.   Hcg: 4/25/: 853          4/29: 533  Past Medical History  Diagnosis Date  . Ectopic pregnancy     Given MTX    Past Surgical History  Procedure Laterality Date  . Induced abortion    . Cesarean section    . Dilation and curettage of uterus      History reviewed. No pertinent family history.  History  Substance Use Topics  . Smoking status: Never Smoker   . Smokeless tobacco: Never Used  . Alcohol Use: No    Allergies: No Known Allergies  No prescriptions prior to admission    ROS Physical Exam   Blood pressure 125/71, pulse 86, temperature 98.3 F (36.8 C), temperature source Oral, resp. rate 16, height 5\' 2"  (1.575 m), weight 79.606 kg (175 lb 8 oz), last menstrual period 02/18/2013, SpO2 99.00%, unknown if currently breastfeeding.  Physical Exam  Nursing note and vitals reviewed. Constitutional: She is oriented to person, place, and time. She appears well-developed and well-nourished. No distress.  Respiratory: Effort normal.  Neurological: She is alert and oriented to person, place, and time.  Psychiatric: She has a normal mood and affect.    MAU Course  Procedures  Results for orders placed during the hospital encounter of 04/03/13 (from the past 24 hour(s))  HCG, QUANTITATIVE, PREGNANCY     Status: Abnormal   Collection Time    04/03/13  6:24 PM      Result Value Range   hCG, Beta Chain, Quant, S 342 (*) <5 mIU/mL    1912: C/W Dr. Debroah Loop repeat hcg in 1 week.  Assessment and Plan   1. S/P ectopic pregnancy    Repeat HCG in 1 week Ectopic precautions given  Tawnya Crook 04/03/2013, 7:12 PM

## 2013-04-10 ENCOUNTER — Encounter (HOSPITAL_COMMUNITY): Payer: Self-pay | Admitting: Obstetrics and Gynecology

## 2013-04-10 ENCOUNTER — Inpatient Hospital Stay (HOSPITAL_COMMUNITY)
Admission: AD | Admit: 2013-04-10 | Discharge: 2013-04-10 | Disposition: A | Discharge status: Home / Self Care | Payer: Self-pay | Source: Ambulatory Visit | Attending: Obstetrics & Gynecology | Admitting: Obstetrics & Gynecology

## 2013-04-10 DIAGNOSIS — O00109 Unspecified tubal pregnancy without intrauterine pregnancy: Secondary | ICD-10-CM | POA: Insufficient documentation

## 2013-04-10 DIAGNOSIS — O009 Unspecified ectopic pregnancy without intrauterine pregnancy: Secondary | ICD-10-CM

## 2013-04-10 NOTE — MAU Note (Signed)
This is day #7 following MTX and here for repeat blood work. Having some headaches and spotting alittle after bleeding had stopped for a week.

## 2013-04-10 NOTE — MAU Note (Signed)
D Poe CNM in to discuss lab results and f/u plan and pt d/c to home

## 2013-04-10 NOTE — MAU Provider Note (Signed)
Chief Complaint: F/U lab     SUBJECTIVE HPI: Gina Bruce is a 22 y.o. [id number] who received MTX 03/27/13 d/t [place] dx of 1.5 cm left adnexal ectopic pregnancy. Serial quant beta hCGs:  03/27/13 -  853       03/31/13 -  533         04/03/13 -   342 She had stopped bleeding but began having light spotting again today. No abd. pain.   Past Medical History  Diagnosis Date  . Ectopic pregnancy     Given MTX   OB History   Grav Para Term Preterm Abortions TAB SAB Ect Mult Living   3 1 1  2 1  1  1      # Outc Date GA Lbr Len/2nd Wgt Sex Del Anes PTL Lv   1 TRM            2 ECT            3 TAB              Past Surgical History  Procedure Laterality Date  . Induced abortion    . Cesarean section    . Dilation and curettage of uterus     History   Social History  . Marital Status: Single    Spouse Name: N/A    Number of Children: N/A  . Years of Education: N/A   Occupational History  . Not on file.   Social History Main Topics  . Smoking status: Never Smoker   . Smokeless tobacco: Never Used  . Alcohol Use: No  . Drug Use: No  . Sexually Active: Yes    Birth Control/ Protection: Condom   Other Topics Concern  . Not on file   Social History Narrative  . No narrative on file   No current facility-administered medications on file prior to encounter.   No current outpatient prescriptions on file prior to encounter.   No Known Allergies  ROS: Pertinent items in HPI  OBJECTIVE There were no vitals filed for this visit.  GENERAL: Well-developed, well-nourished female in no acute distress.  HEENT: Normocephalic HEART: normal rate RESP: normal effort ABDOMEN: Soft, non-tender EXTREMITIES: Nontender, no edema NEURO: Alert and oriented   LAB RESULTS Results for orders placed during the hospital encounter of 04/10/13 (from the past 24 hour(s))  HCG, QUANTITATIVE, PREGNANCY     Status: Abnormal   Collection Time    04/10/13  7:00 PM      Result Value Range   hCG,  Beta Chain, Quant, S 75 (*) <5 mIU/mL    IMAGING US Ob Comp Less 14 Wks  03/27/2013  OBSTETRICAL ULTRASOUND: This exam was performed within a [facility]. The OB US report was generated in the AS system, and faxed to the ordering physician.   This report is also available in [employer] and in the [employer]. See AS Obstetric US report.   US Ob Transvaginal  03/27/2013  OBSTETRICAL ULTRASOUND: This exam was performed within a [facility]. The OB US report was generated in the AS system, and faxed to the ordering physician.   This report is also available in [employer] and in the [employer]. See [facility] Obstetric US report.      ASSESSMENT 1. Ectopic pregnancy   2 wks S/P MTX  PLAN Discharge home    Medication List     As of 04/10/2013  8:18 PM  Notice      You have not been prescribed any medications.        Follow-up Information   Follow up with [facility] In 1 week.   Contact information:   [address] [place] [address] [phone]       Danae Orleans, CNM 04/10/2013  7:05 PM

## 2013-04-17 ENCOUNTER — Other Ambulatory Visit: Payer: Self-pay

## 2013-04-20 NOTE — MAU Provider Note (Signed)
Attestation of Attending Supervision of Advanced Practitioner (CNM/NP): Evaluation and management procedures were performed by the Advanced Practitioner under my supervision and collaboration. I have reviewed the Advanced Practitioner's note and chart, and I agree with the management and plan.  Valyn Latchford H. 3:59 PM

## 2013-06-06 IMAGING — CR DG CHEST 2V
2 series · 2 of 2 positions shown · non-contrast
Comparison: 01/12/2013

CLINICAL DATA: Back pain

CHEST - 2 VIEW

[w chest pa]
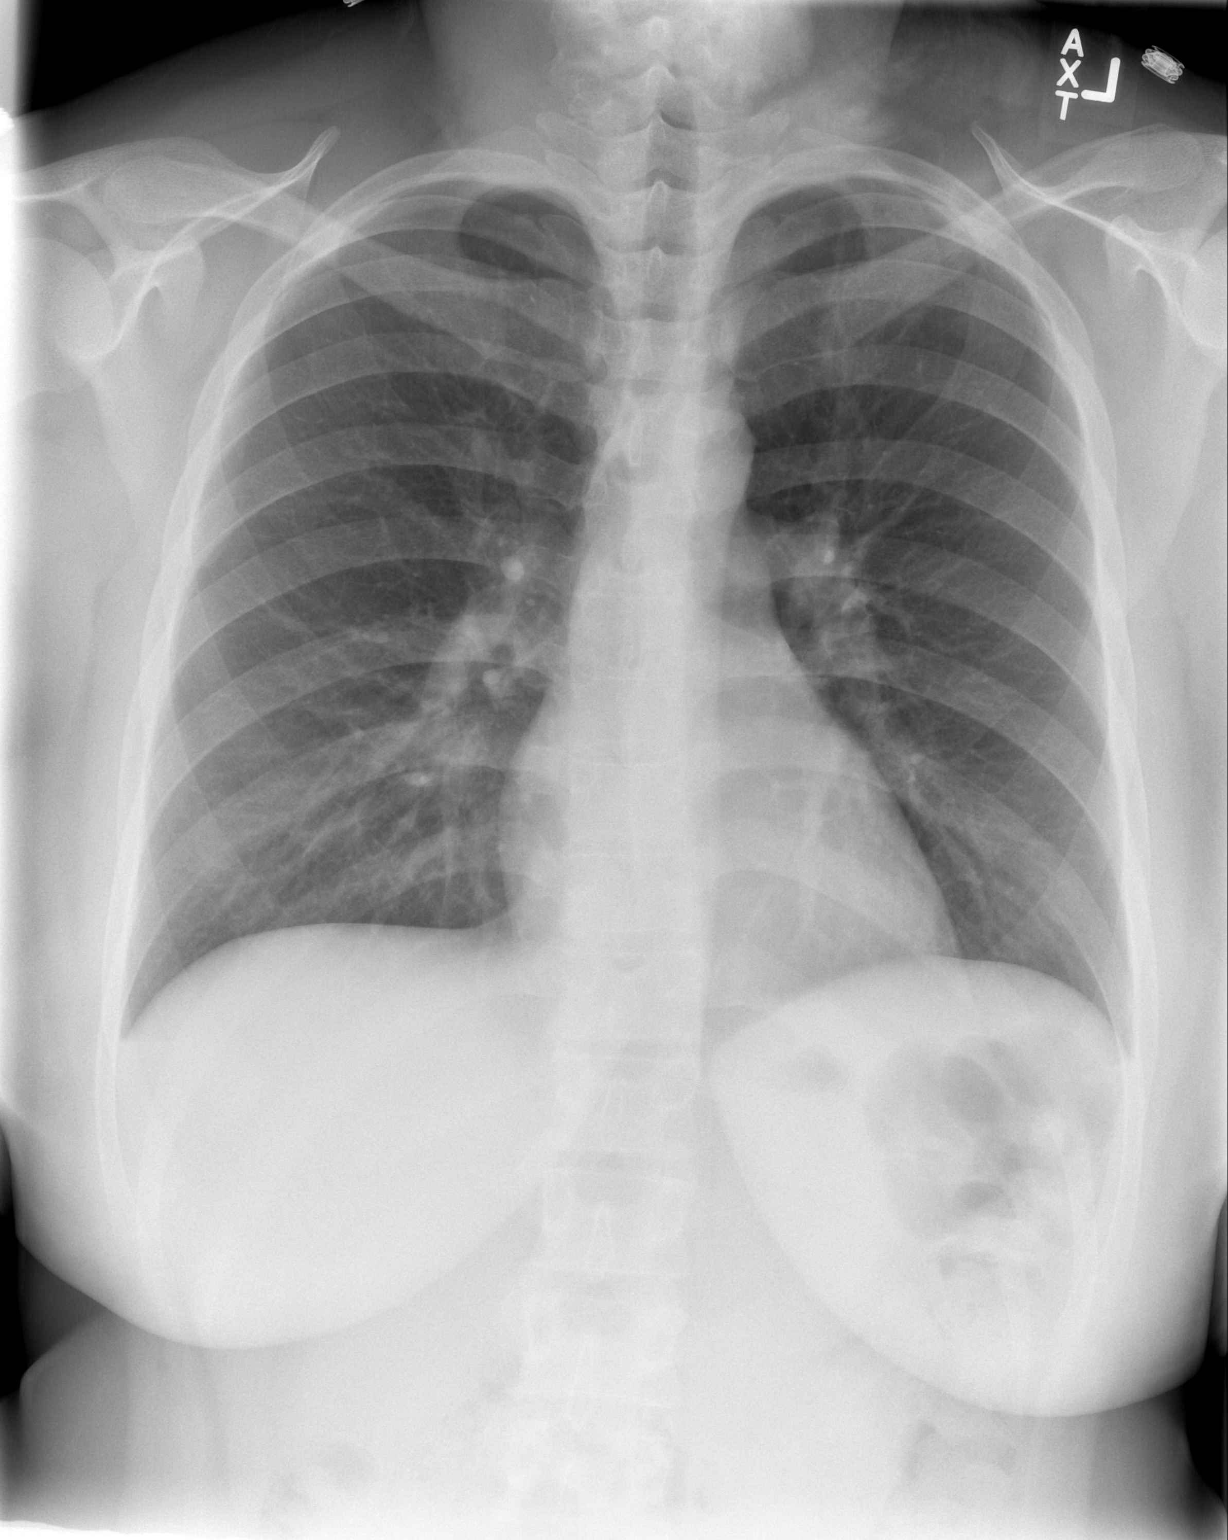

[w chest lat]
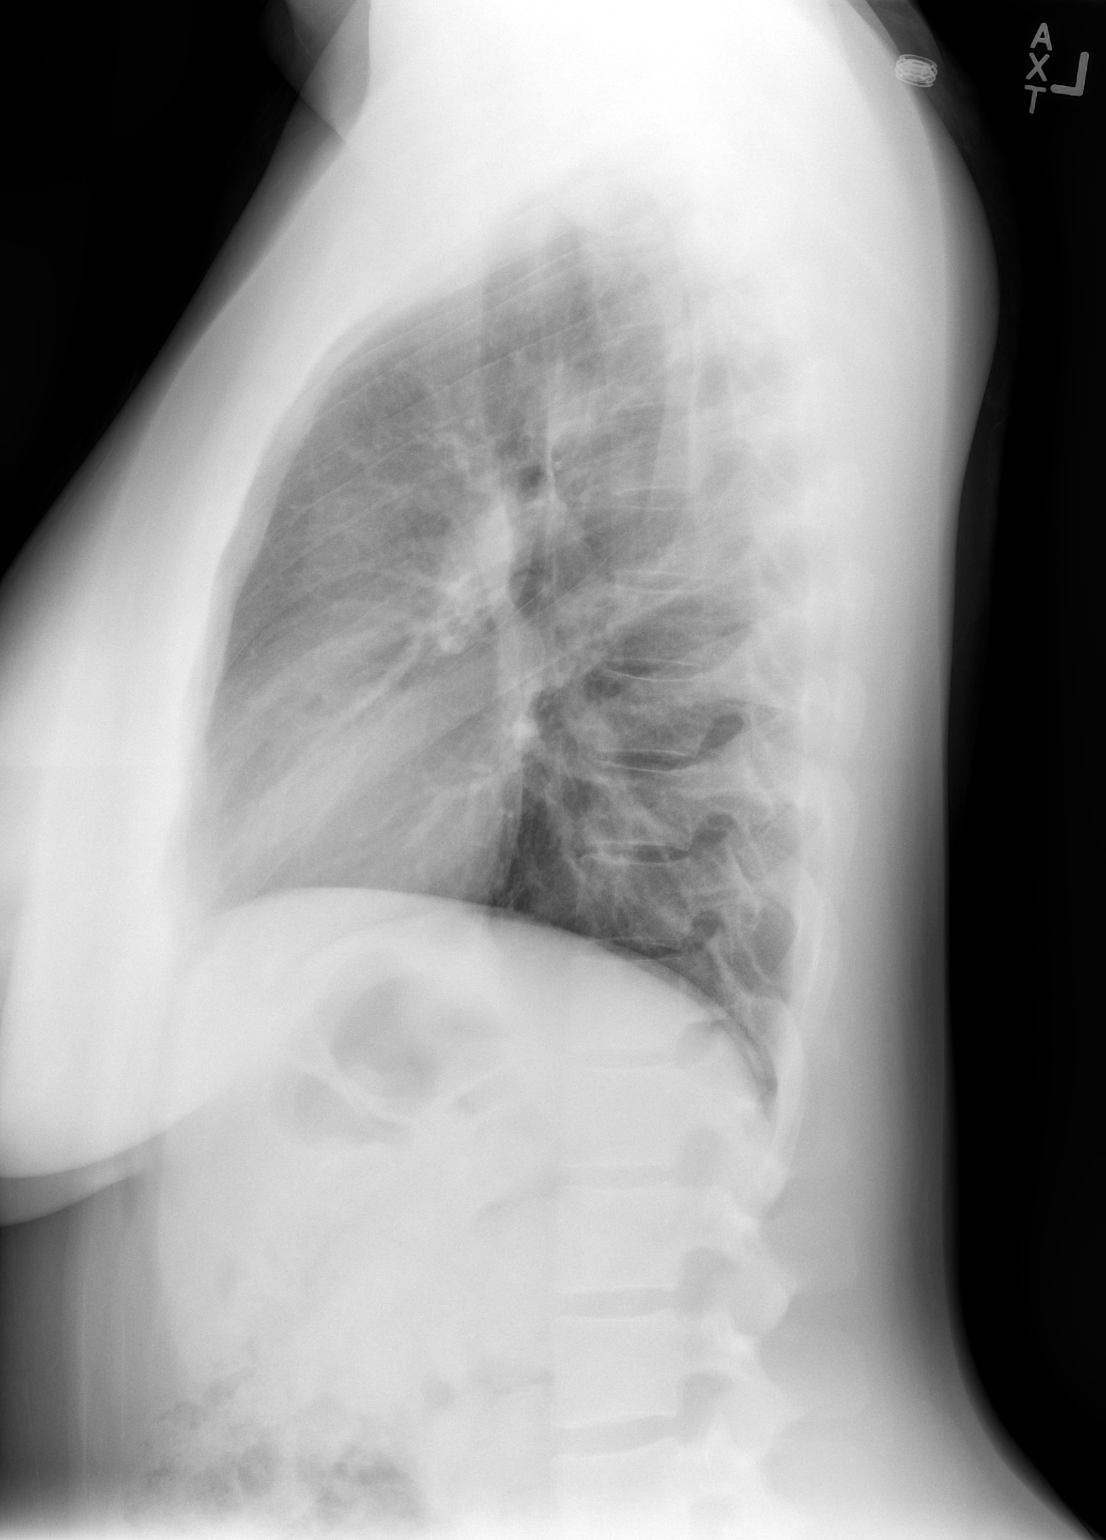

[2 of 2 positions shown; findings below may reference images not displayed]

FINDINGS: Normal heart size.  Clear lungs.
IMPRESSION: Negative.

## 2013-06-06 IMAGING — US US ABDOMEN COMPLETE
1 series · 14 of 25 positions shown · non-contrast
Comparison: 10/06/2011.

CLINICAL DATA: Right upper quadrant pain

ABDOMINAL ULTRASOUND COMPLETE

[Series 1: us abdomen complete · 0.25mm/px · 14 of 104 slices shown]
[im 1/104]
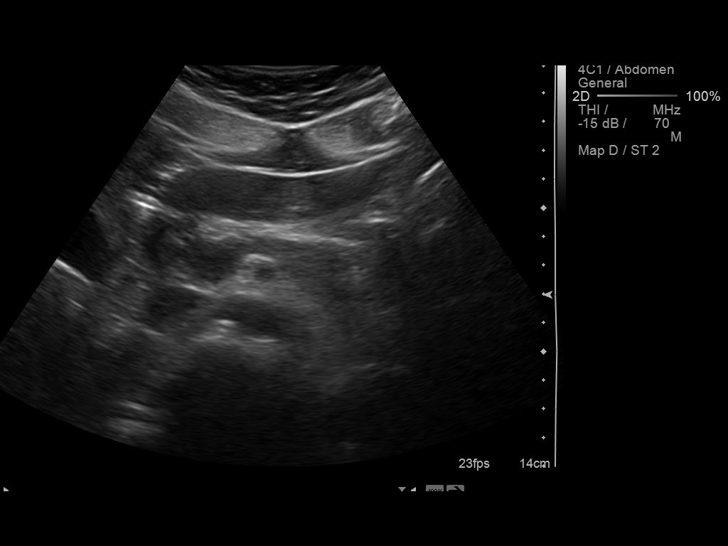
[im 9/104]
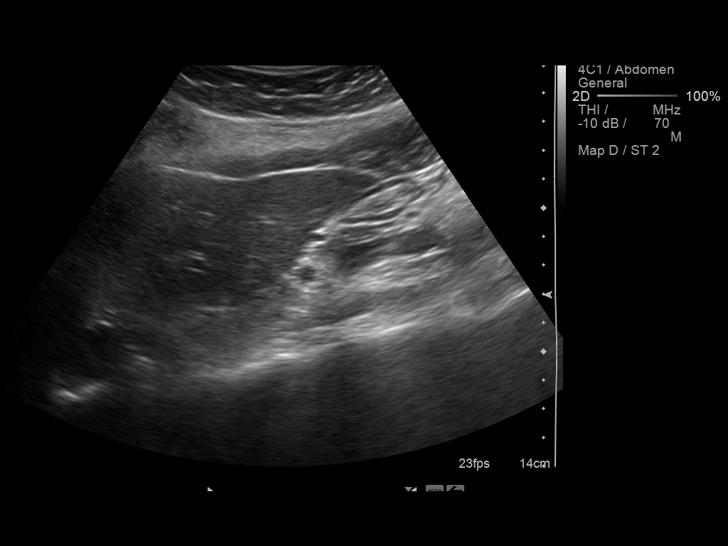
[im 18/104]
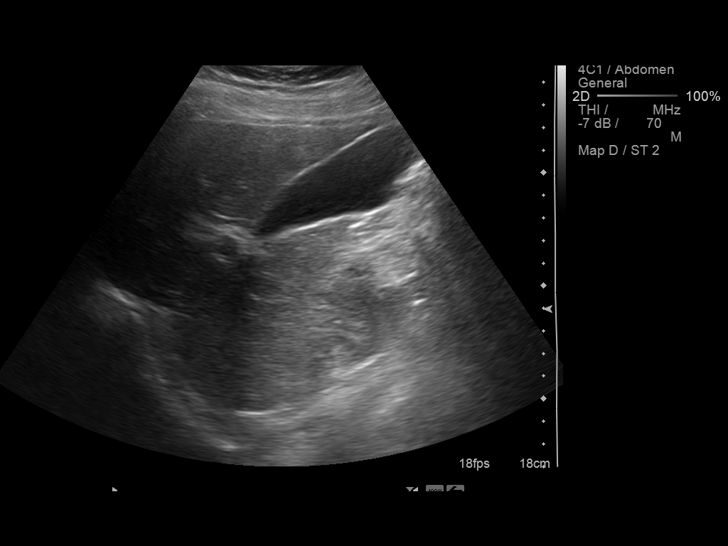
[im 26/104]
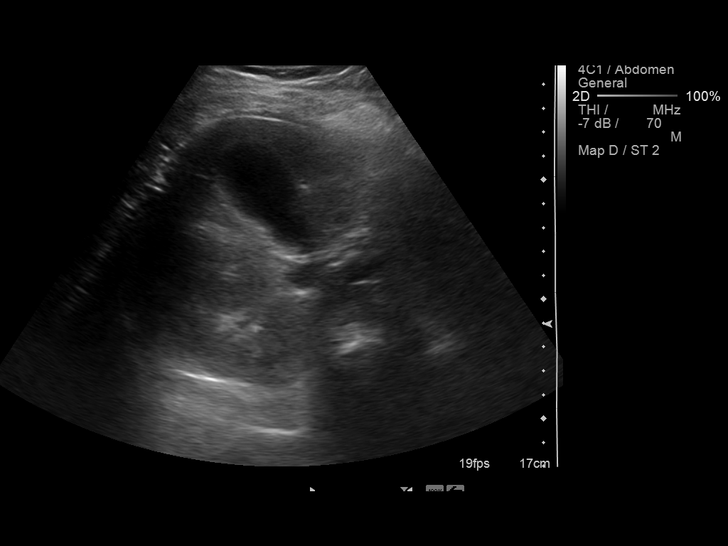
[im 35/104]
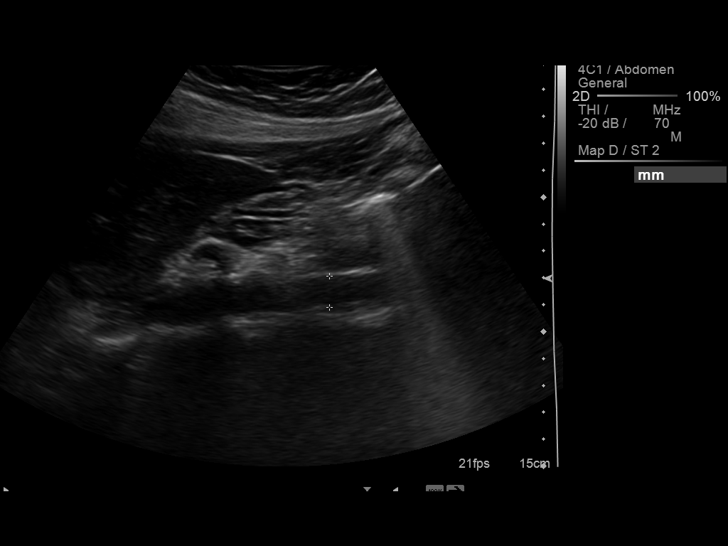
[im 39/104]
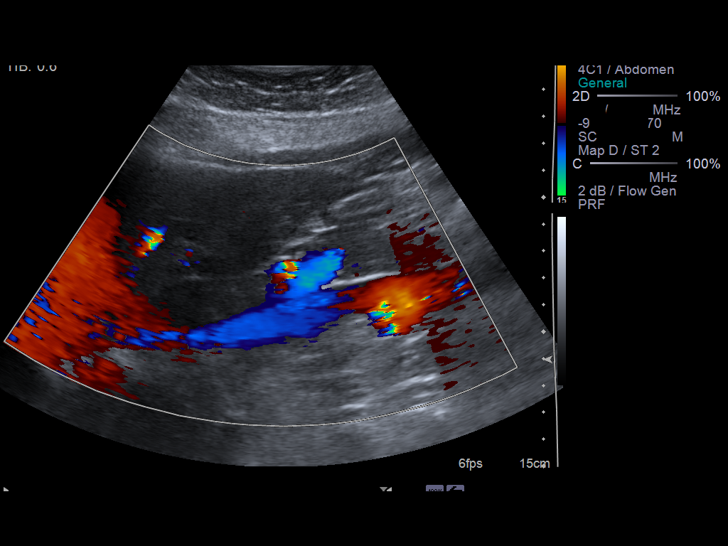
[im 48/104]
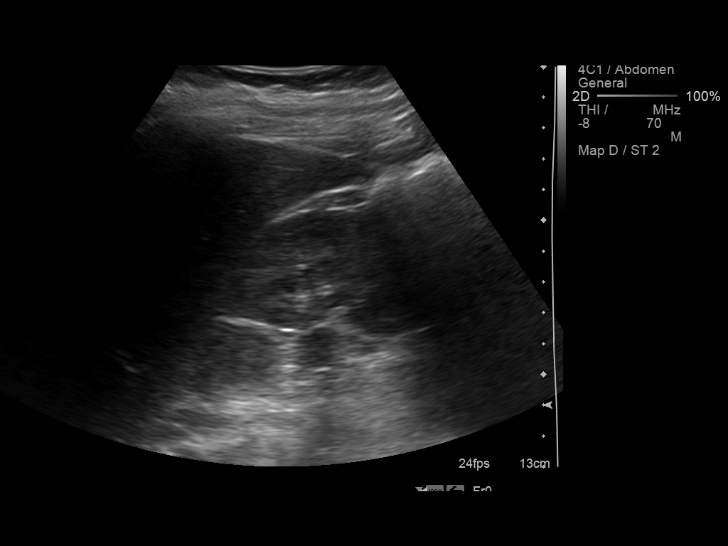
[im 56/104]
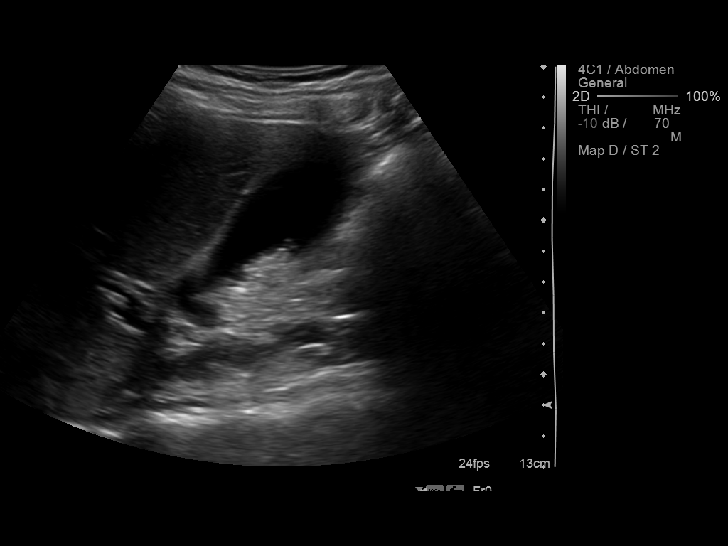
[im 65/104]
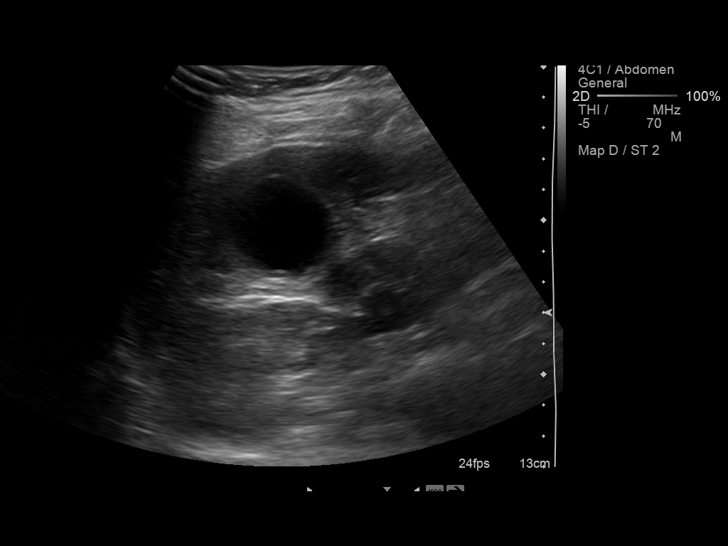
[im 69/104]
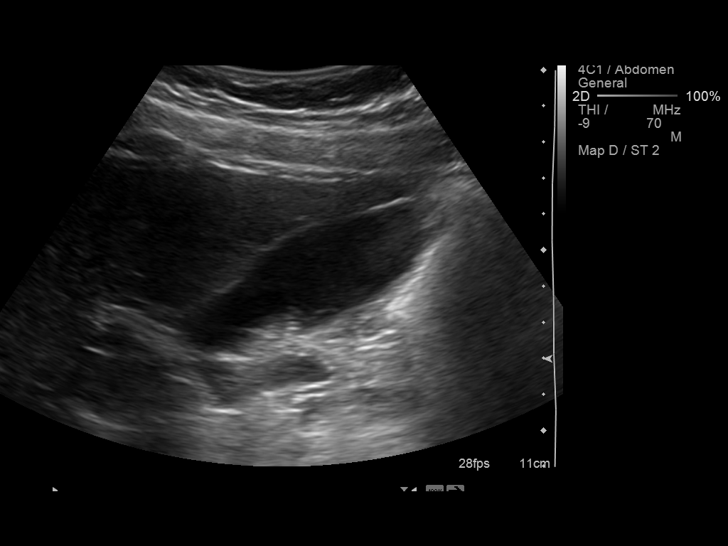
[im 78/104]
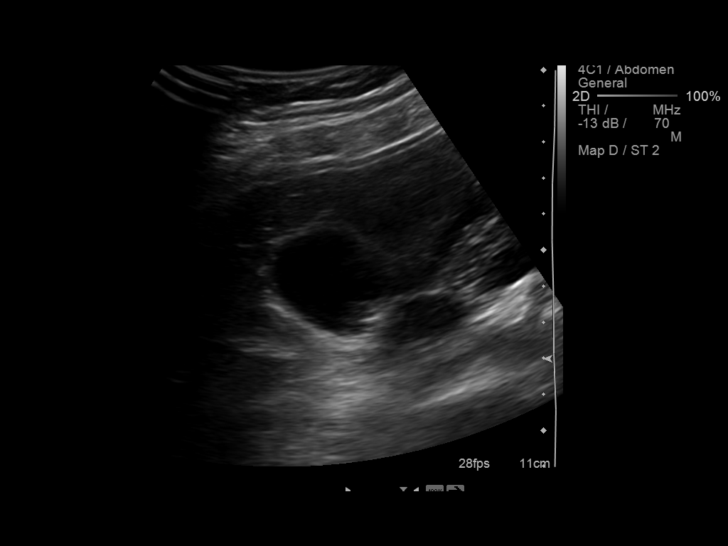
[im 86/104]
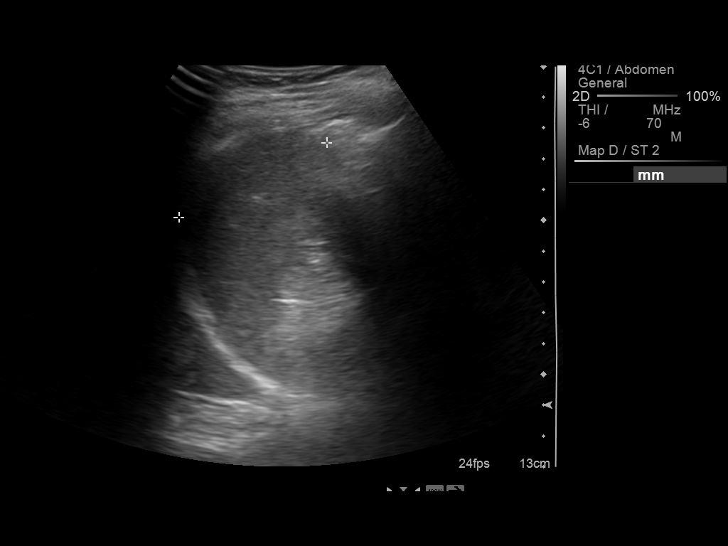
[im 95/104]
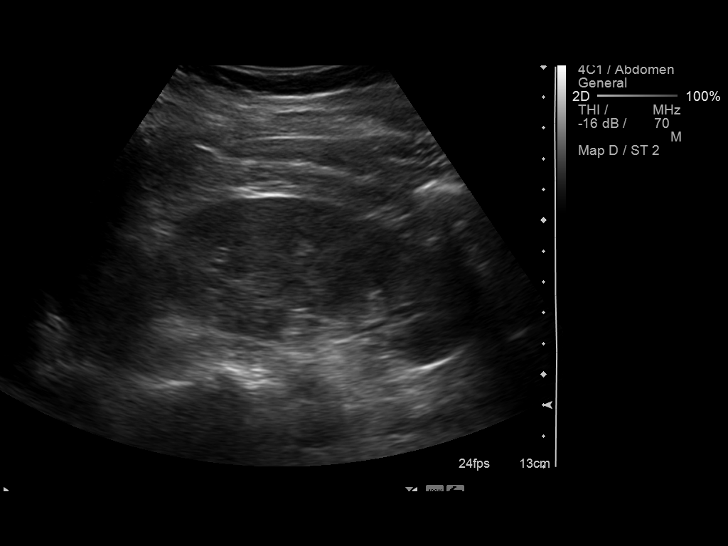
[im 104/104]
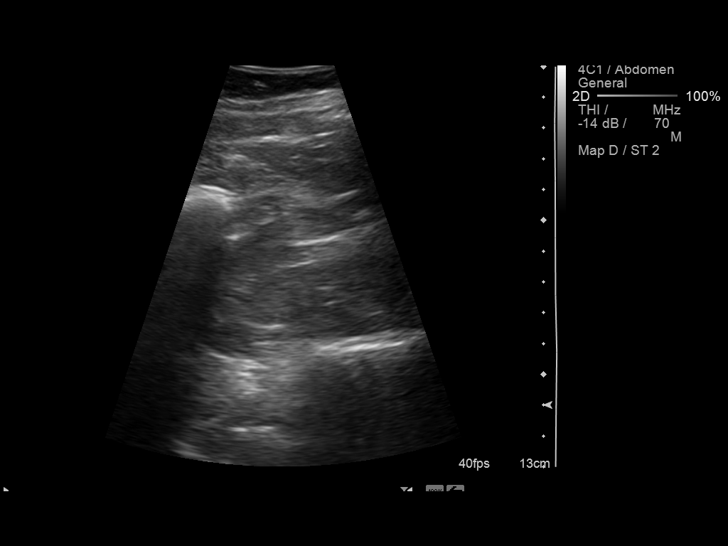

[14 of 25 positions shown; findings below may reference images not displayed]

FINDINGS: Gallbladder:  There is an oval hyperechoic mobile structure within
the dependent portion of the gallbladder without shadowing
compatible with a sludge ball versus a nonshadowing calculus.  This
is stable.  Wall is upper normal in thickness.  No pericholecystic
fluid.  No Murphy's sign.

Common Bile Duct:  Within normal limits in caliber.

Liver: No focal mass lesion identified.  Within normal limits in
parenchymal echogenicity.

IVC:  Appears normal.

Pancreas:  No abnormality identified.

Spleen:  Within normal limits in size and echotexture.

Right kidney:  Normal in size and parenchymal echogenicity.  No
evidence of mass or hydronephrosis.

Left kidney:  Normal in size and parenchymal echogenicity.  No
evidence of mass or hydronephrosis.

Abdominal Aorta:  No aneurysm identified.
IMPRESSION: Stable echogenic structure within the gallbladder representing
either a sludge ball or nonshadowing calculus.

## 2013-07-05 ENCOUNTER — Encounter (HOSPITAL_COMMUNITY): Payer: Self-pay | Admitting: Family

## 2013-07-05 ENCOUNTER — Inpatient Hospital Stay (HOSPITAL_COMMUNITY)
Admission: AD | Admit: 2013-07-05 | Discharge: 2013-07-05 | Disposition: A | Discharge status: Home / Self Care | Payer: Medicaid Other | Source: Ambulatory Visit | Attending: Family Medicine | Admitting: Family Medicine

## 2013-07-05 ENCOUNTER — Inpatient Hospital Stay (HOSPITAL_COMMUNITY): Payer: Medicaid Other

## 2013-07-05 DIAGNOSIS — O2341 Unspecified infection of urinary tract in pregnancy, first trimester: Secondary | ICD-10-CM

## 2013-07-05 DIAGNOSIS — R109 Unspecified abdominal pain: Secondary | ICD-10-CM | POA: Insufficient documentation

## 2013-07-05 DIAGNOSIS — O2390 Unspecified genitourinary tract infection in pregnancy, unspecified trimester: Secondary | ICD-10-CM | POA: Insufficient documentation

## 2013-07-05 DIAGNOSIS — N390 Urinary tract infection, site not specified: Secondary | ICD-10-CM | POA: Insufficient documentation

## 2013-07-05 LAB — URINALYSIS, ROUTINE W REFLEX MICROSCOPIC
Bilirubin Urine: NEGATIVE
Hgb urine dipstick: NEGATIVE
Ketones, ur: NEGATIVE mg/dL
Protein, ur: NEGATIVE mg/dL
Urobilinogen, UA: 2 mg/dL — ABNORMAL HIGH (ref 0.0–1.0)

## 2013-07-05 LAB — CBC
Hemoglobin: 12.4 g/dL (ref 12.0–15.0)
Platelets: 315 10*3/uL (ref 150–400)
RBC: 4.78 MIL/uL (ref 3.87–5.11)
WBC: 9.9 10*3/uL (ref 4.0–10.5)

## 2013-07-05 LAB — WET PREP, GENITAL
Trich, Wet Prep: NONE SEEN
Yeast Wet Prep HPF POC: NONE SEEN

## 2013-07-05 LAB — URINE MICROSCOPIC-ADD ON

## 2013-07-05 LAB — HCG, QUANTITATIVE, PREGNANCY: hCG, Beta Chain, Quant, S: 73134 m[IU]/mL — ABNORMAL HIGH (ref <5)

## 2013-07-05 LAB — ABO/RH: ABO/RH(D): A POS

## 2013-07-05 MED ORDER — CEPHALEXIN 500 MG PO CAPS: 500.0000 mg | ORAL_CAPSULE | Freq: Four times a day (QID) | ORAL | Status: DC

## 2013-07-05 NOTE — MAU Note (Signed)
Patient states she had an ectopic pregnancy with MTX  In May. Had a period 6-2 and has not had a period since. Has had abdominal cramping for about one week. Denies bleeding or discharge. Has had a negative pregnancy test at home on 7-18.

## 2013-07-05 NOTE — MAU Provider Note (Signed)
History     CSN: [id number]  Arrival date and time: 07/05/13 1749   None     Chief Complaint  Patient presents with  . Possible Pregnancy  . Abdominal Cramping   Possible Pregnancy  Abdominal Cramping    Gina Bruce is a 22 y.o. [id number] at [redacted]w[redacted]d who presents today with cramping x 3 days. She denies any vaginal bleeding, vaginal discharge or dysuria. She states that she had an ectopic pregnancy in May and had MTX for it.   Past Medical History  Diagnosis Date  . Ectopic pregnancy     Given MTX    Past Surgical History  Procedure Laterality Date  . Induced abortion    . Cesarean section    . Dilation and curettage of uterus      No family history on file.  History  Substance Use Topics  . Smoking status: Never Smoker   . Smokeless tobacco: Never Used  . Alcohol Use: No    Allergies: No Known Allergies  No prescriptions prior to admission    ROS Physical Exam   Blood pressure 122/74, pulse 81, temperature 99 F (37.2 C), temperature source Oral, resp. rate 16, height 5\' 3"  (1.6 m), weight 80.105 kg (176 lb 9.6 oz), last menstrual period 05/04/2013, SpO2 98.00%.  Physical Exam  Nursing note and vitals reviewed. Constitutional: She is oriented to person, place, and time. She appears well-developed and well-nourished. No distress.  Cardiovascular: Normal rate.   Respiratory: Effort normal.  GI: Soft. There is no tenderness.  Genitourinary:   .External: no lesion Vagina: small amount of white discharge Cervix: pink, smooth, no CMT Uterus: NSSC Adnexa: NT   Neurological: She is alert and oriented to person, place, and time.  Skin: Skin is warm and dry.  Psychiatric: She has a normal mood and affect.    MAU Course  Procedures  Results for orders placed during the hospital encounter of 07/05/13 (from the past 24 hour(s))  URINALYSIS, ROUTINE W REFLEX MICROSCOPIC     Status: Abnormal   Collection Time    07/05/13  6:10 PM      Result Value  Range   Color, Urine YELLOW  YELLOW   APPearance CLEAR  CLEAR   Specific Gravity, Urine 1.020  1.005 - 1.030   pH 6.0  5.0 - 8.0   Glucose, UA NEGATIVE  NEGATIVE mg/dL   Hgb urine dipstick NEGATIVE  NEGATIVE   Bilirubin Urine NEGATIVE  NEGATIVE   Ketones, ur NEGATIVE  NEGATIVE mg/dL   Protein, ur NEGATIVE  NEGATIVE mg/dL   Urobilinogen, UA 2.0 (*) 0.0 - 1.0 mg/dL   Nitrite POSITIVE (*) NEGATIVE   Leukocytes, UA TRACE (*) NEGATIVE  URINE MICROSCOPIC-ADD ON     Status: Abnormal   Collection Time    07/05/13  6:10 PM      Result Value Range   Squamous Epithelial / LPF FEW (*) RARE   WBC, UA 3-6  <3 WBC/hpf   RBC / HPF 0-2  <3 RBC/hpf   Bacteria, UA MANY (*) RARE  POCT PREGNANCY, URINE     Status: Abnormal   Collection Time    07/05/13  6:12 PM      Result Value Range   Preg Test, Ur POSITIVE (*) NEGATIVE  CBC     Status: Abnormal   Collection Time    07/05/13  6:35 PM      Result Value Range   WBC 9.9  4.0 - 10.5 K/uL  RBC 4.78  3.87 - 5.11 MIL/uL   Hemoglobin 12.4  12.0 - 15.0 g/dL   HCT 16.1 (*) 09.6 - 04.5 %   MCV 74.7 (*) 78.0 - 100.0 fL   MCH 25.9 (*) 26.0 - 34.0 pg   MCHC 34.7  30.0 - 36.0 g/dL   RDW 40.9  81.1 - 91.4 %   Platelets 315  150 - 400 K/uL  HCG, QUANTITATIVE, PREGNANCY     Status: Abnormal   Collection Time    07/05/13  6:35 PM      Result Value Range   hCG, Beta Chain, Quant, S [id number] (*) <5 mIU/mL  WET PREP, GENITAL     Status: Abnormal   Collection Time    07/05/13  6:45 PM      Result Value Range   Yeast Wet Prep HPF POC NONE SEEN  NONE SEEN   Trich, Wet Prep NONE SEEN  NONE SEEN   Clue Cells Wet Prep HPF POC FEW (*) NONE SEEN   WBC, Wet Prep HPF POC FEW (*) NONE SEEN   US Ob Comp Less 14 Wks  07/05/2013   *RADIOLOGY REPORT*  Clinical Data: Pelvic pain.  Early pregnancy.  OBSTETRIC <14 WK [place] AND TRANSVAGINAL OB US  Technique:  Both transabdominal and transvaginal ultrasound examinations were performed for complete evaluation of the gestation as  well as the maternal uterus, adnexal regions, and pelvic cul-de-sac.  Transvaginal technique was performed to assess early pregnancy.  Comparison:  03/27/2013  Intrauterine gestational sac:  Visualized/normal in shape. Yolk sac: Present Embryo: Present Cardiac Activity: Present Heart Rate: 167 bpm  CRL: 16.5  mm  8 w  1 d             [place] EDC: 02/13/2014  Maternal uterus/adnexae: Small subchorionic hemorrhage. Corpus luteum cyst right ovary. Normal left ovary. No free pelvic fluid.  IMPRESSION:  1. Living intrauterine fetus estimated 8 weeks and 1 day gestation. 2.  Small subchorionic hemorrhage. 3.  Corpus luteum cyst right ovary.   Original Report Authenticated By: Rudie Meyer, M.D.     Assessment and Plan   1. UTI (urinary tract infection) in pregnancy, antepartum, first trimester    RX: Keflex 500 QID x 7 Start [facility] as soon as possible 1st trimester danger signs reviewed   Tawnya Crook 07/05/2013, 6:47 PM

## 2013-07-06 LAB — GC/CHLAMYDIA PROBE AMP: GC Probe RNA: NEGATIVE

## 2013-07-06 NOTE — MAU Provider Note (Signed)
Chart reviewed and agree with management and plan.  

## 2013-07-07 LAB — URINE CULTURE

## 2013-08-06 ENCOUNTER — Other Ambulatory Visit (HOSPITAL_COMMUNITY): Payer: Self-pay | Admitting: Physician Assistant

## 2013-08-06 DIAGNOSIS — Z0489 Encounter for examination and observation for other specified reasons: Secondary | ICD-10-CM

## 2013-08-06 LAB — OB RESULTS CONSOLE GC/CHLAMYDIA
Chlamydia: NEGATIVE
Gonorrhea: NEGATIVE

## 2013-08-06 LAB — OB RESULTS CONSOLE RPR: RPR: NONREACTIVE

## 2013-08-06 LAB — OB RESULTS CONSOLE HIV ANTIBODY (ROUTINE TESTING): HIV: NONREACTIVE

## 2013-09-10 ENCOUNTER — Ambulatory Visit (HOSPITAL_COMMUNITY)
Admission: RE | Admit: 2013-09-10 | Discharge: 2013-09-10 | Disposition: A | Discharge status: Home / Self Care | Payer: Medicaid Other | Source: Ambulatory Visit | Attending: Physician Assistant | Admitting: Physician Assistant

## 2013-09-10 ENCOUNTER — Other Ambulatory Visit (HOSPITAL_COMMUNITY): Payer: Self-pay | Admitting: Physician Assistant

## 2013-09-10 DIAGNOSIS — Z3689 Encounter for other specified antenatal screening: Secondary | ICD-10-CM | POA: Insufficient documentation

## 2013-09-10 DIAGNOSIS — Z0489 Encounter for examination and observation for other specified reasons: Secondary | ICD-10-CM

## 2013-09-10 DIAGNOSIS — O34219 Maternal care for unspecified type scar from previous cesarean delivery: Secondary | ICD-10-CM | POA: Insufficient documentation

## 2013-09-18 ENCOUNTER — Other Ambulatory Visit (HOSPITAL_COMMUNITY): Payer: Self-pay | Admitting: Nurse Practitioner

## 2013-09-18 DIAGNOSIS — Z0489 Encounter for examination and observation for other specified reasons: Secondary | ICD-10-CM

## 2013-10-21 ENCOUNTER — Other Ambulatory Visit (HOSPITAL_COMMUNITY): Payer: Self-pay | Admitting: Nurse Practitioner

## 2013-10-21 ENCOUNTER — Ambulatory Visit (HOSPITAL_COMMUNITY)
Admission: RE | Admit: 2013-10-21 | Discharge: 2013-10-21 | Disposition: A | Discharge status: Home / Self Care | Payer: Medicaid Other | Source: Ambulatory Visit | Attending: Nurse Practitioner | Admitting: Nurse Practitioner

## 2013-10-21 DIAGNOSIS — Z363 Encounter for antenatal screening for malformations: Secondary | ICD-10-CM | POA: Insufficient documentation

## 2013-10-21 DIAGNOSIS — Z0489 Encounter for examination and observation for other specified reasons: Secondary | ICD-10-CM

## 2013-10-21 DIAGNOSIS — IMO0002 Reserved for concepts with insufficient information to code with codable children: Secondary | ICD-10-CM

## 2013-10-21 DIAGNOSIS — Z1389 Encounter for screening for other disorder: Secondary | ICD-10-CM | POA: Insufficient documentation

## 2014-01-14 ENCOUNTER — Encounter (HOSPITAL_COMMUNITY): Payer: Self-pay | Admitting: *Deleted

## 2014-01-14 ENCOUNTER — Inpatient Hospital Stay (HOSPITAL_COMMUNITY)
Admission: AD | Admit: 2014-01-14 | Discharge: 2014-01-16 | DRG: 781 | Disposition: A | Discharge status: Home / Self Care | Payer: Medicaid Other | Source: Ambulatory Visit | Attending: Obstetrics & Gynecology | Admitting: Obstetrics & Gynecology

## 2014-01-14 DIAGNOSIS — O2303 Infections of kidney in pregnancy, third trimester: Secondary | ICD-10-CM | POA: Diagnosis present

## 2014-01-14 DIAGNOSIS — O2390 Unspecified genitourinary tract infection in pregnancy, unspecified trimester: Principal | ICD-10-CM | POA: Diagnosis present

## 2014-01-14 DIAGNOSIS — O4700 False labor before 37 completed weeks of gestation, unspecified trimester: Secondary | ICD-10-CM | POA: Diagnosis present

## 2014-01-14 DIAGNOSIS — O34219 Maternal care for unspecified type scar from previous cesarean delivery: Secondary | ICD-10-CM | POA: Diagnosis present

## 2014-01-14 DIAGNOSIS — N12 Tubulo-interstitial nephritis, not specified as acute or chronic: Secondary | ICD-10-CM | POA: Diagnosis present

## 2014-01-14 HISTORY — DX: Calculus of gallbladder without cholecystitis without obstruction: K80.20

## 2014-01-14 LAB — CBC WITH DIFFERENTIAL/PLATELET
BASOS ABS: 0 10*3/uL (ref 0.0–0.1)
Basophils Relative: 0 % (ref 0–1)
EOS ABS: 0 10*3/uL (ref 0.0–0.7)
EOS PCT: 0 % (ref 0–5)
HCT: 29.2 % — ABNORMAL LOW (ref 36.0–46.0)
Hemoglobin: 9.8 g/dL — ABNORMAL LOW (ref 12.0–15.0)
Lymphocytes Relative: 7 % — ABNORMAL LOW (ref 12–46)
Lymphs Abs: 1.1 10*3/uL (ref 0.7–4.0)
MCH: 23.9 pg — AB (ref 26.0–34.0)
MCHC: 33.6 g/dL (ref 30.0–36.0)
MCV: 71.2 fL — AB (ref 78.0–100.0)
Monocytes Absolute: 3.1 10*3/uL — ABNORMAL HIGH (ref 0.1–1.0)
Monocytes Relative: 19 % — ABNORMAL HIGH (ref 3–12)
NEUTROS PCT: 74 % (ref 43–77)
Neutro Abs: 12.3 10*3/uL — ABNORMAL HIGH (ref 1.7–7.7)
Platelets: 246 10*3/uL (ref 150–400)
RBC: 4.1 MIL/uL (ref 3.87–5.11)
RDW: 16.4 % — AB (ref 11.5–15.5)
WBC: 16.6 10*3/uL — AB (ref 4.0–10.5)

## 2014-01-14 LAB — COMPREHENSIVE METABOLIC PANEL
ALBUMIN: 2.5 g/dL — AB (ref 3.5–5.2)
ALK PHOS: 87 U/L (ref 39–117)
ALT: 8 U/L (ref 0–35)
AST: 13 U/L (ref 0–37)
BILIRUBIN TOTAL: 1.1 mg/dL (ref 0.3–1.2)
CHLORIDE: 102 meq/L (ref 96–112)
CO2: 21 mEq/L (ref 19–32)
CREATININE: 0.45 mg/dL — AB (ref 0.50–1.10)
Calcium: 9 mg/dL (ref 8.4–10.5)
GFR calc Af Amer: >90 mL/min (ref >90)
Glucose, Bld: 81 mg/dL (ref 70–99)
Potassium: 2.9 mEq/L — CL (ref 3.7–5.3)
Sodium: 138 mEq/L (ref 137–147)
Total Protein: 6.1 g/dL (ref 6.0–8.3)

## 2014-01-14 LAB — URINE MICROSCOPIC-ADD ON

## 2014-01-14 LAB — URINALYSIS, ROUTINE W REFLEX MICROSCOPIC
Bilirubin Urine: NEGATIVE
Glucose, UA: NEGATIVE mg/dL
Hgb urine dipstick: NEGATIVE
Nitrite: POSITIVE — AB
PROTEIN: NEGATIVE mg/dL
Specific Gravity, Urine: 1.015 (ref 1.005–1.030)
UROBILINOGEN UA: 4 mg/dL — AB (ref 0.0–1.0)
pH: 6 (ref 5.0–8.0)

## 2014-01-14 LAB — TYPE AND SCREEN
ABO/RH(D): A POS
Antibody Screen: NEGATIVE

## 2014-01-14 LAB — RPR: RPR: NONREACTIVE

## 2014-01-14 LAB — ABO/RH: ABO/RH(D): A POS

## 2014-01-14 MED ORDER — CYCLOBENZAPRINE HCL 10 MG PO TABS
10.0000 mg | ORAL_TABLET | Freq: Once | ORAL | Status: AC
  Administered 2014-01-14: 10 mg via ORAL
  Filled 2014-01-14: qty 1

## 2014-01-14 MED ORDER — ONDANSETRON HCL 4 MG/2ML IJ SOLN: 4.0000 mg | Freq: Four times a day (QID) | INTRAMUSCULAR | Status: DC | PRN

## 2014-01-14 MED ORDER — DEXTROSE 5 % IV SOLN
1.0000 g | Freq: Two times a day (BID) | INTRAVENOUS | Status: DC
  Administered 2014-01-14 – 2014-01-16 (×4): 1 g via INTRAVENOUS
  Filled 2014-01-14 (×4): qty 10

## 2014-01-14 MED ORDER — ONDANSETRON HCL 4 MG/2ML IJ SOLN
4.0000 mg | Freq: Four times a day (QID) | INTRAMUSCULAR | Status: DC | PRN
  Administered 2014-01-14: 4 mg via INTRAVENOUS
  Filled 2014-01-14: qty 2

## 2014-01-14 MED ORDER — ZOLPIDEM TARTRATE 5 MG PO TABS: 5.0000 mg | ORAL_TABLET | Freq: Every evening | ORAL | Status: DC | PRN

## 2014-01-14 MED ORDER — POTASSIUM CHLORIDE CRYS ER 20 MEQ PO TBCR
20.0000 meq | EXTENDED_RELEASE_TABLET | Freq: Two times a day (BID) | ORAL | Status: DC
  Administered 2014-01-15 (×2): 20 meq via ORAL
  Filled 2014-01-14 (×4): qty 1

## 2014-01-14 MED ORDER — OXYCODONE-ACETAMINOPHEN 5-325 MG PO TABS
1.0000 | ORAL_TABLET | Freq: Once | ORAL | Status: AC
  Administered 2014-01-14: 1 via ORAL
  Filled 2014-01-14: qty 1

## 2014-01-14 MED ORDER — CALCIUM CARBONATE ANTACID 500 MG PO CHEW: 2.0000 | CHEWABLE_TABLET | ORAL | Status: DC | PRN

## 2014-01-14 MED ORDER — SODIUM CHLORIDE 0.9 % IV SOLN
INTRAVENOUS | Status: DC
  Administered 2014-01-14 – 2014-01-16 (×5): via INTRAVENOUS

## 2014-01-14 MED ORDER — OXYCODONE-ACETAMINOPHEN 5-325 MG PO TABS: 1.0000 | ORAL_TABLET | ORAL | Status: DC | PRN

## 2014-01-14 MED ORDER — PRENATAL MULTIVITAMIN CH
1.0000 | ORAL_TABLET | Freq: Every day | ORAL | Status: DC
  Administered 2014-01-15: 1 via ORAL
  Filled 2014-01-14 (×2): qty 1

## 2014-01-14 MED ORDER — ACETAMINOPHEN 500 MG PO TABS: 1000.0000 mg | ORAL_TABLET | Freq: Four times a day (QID) | ORAL | Status: DC | PRN

## 2014-01-14 MED ORDER — CEFTRIAXONE SODIUM 1 G IJ SOLR
1.0000 g | Freq: Once | INTRAMUSCULAR | Status: DC
  Filled 2014-01-14: qty 10

## 2014-01-14 MED ORDER — DOCUSATE SODIUM 100 MG PO CAPS
100.0000 mg | ORAL_CAPSULE | Freq: Every day | ORAL | Status: DC
Start: 2014-01-14 — End: 2014-01-16
  Administered 2014-01-15: 100 mg via ORAL
  Filled 2014-01-14: qty 1

## 2014-01-14 MED ORDER — LACTATED RINGERS IV BOLUS (SEPSIS)
1000.0000 mL | Freq: Once | INTRAVENOUS | Status: AC
  Administered 2014-01-14: 1000 mL via INTRAVENOUS

## 2014-01-14 NOTE — Progress Notes (Signed)
Dr Richarda BladeAdamo notified of Potassium level of 2.9, no new orders recieved

## 2014-01-14 NOTE — H&P (Signed)
FACULTY PRACTICE ANTEPARTUM ADMISSION HISTORY AND PHYSICAL NOTE  History of Present Illness: Gina Bruce is a 23 y.o. [phone] at 1434w3d admitted for presumed complicated UTI/pyelonephritis.  [facility], she was not found to be in labor but complained about back pain.  UA showed nitrites and many bacteria, temp in MAU was 100.2.  The decision was made to admit her for treatment and observation.  Patient reports the fetal movement as active. Patient reports uterine contraction  activity as irregular. Patient reports  vaginal bleeding as none. Patient describes fluid per vagina as None. Fetal presentation is unsure, patient desires repeat cesarean section if delivery is indicated.  Patient Active Problem List   Diagnosis Date Noted  . Pyelonephritis complicating pregnancy in third trimester, antepartum 01/14/2014  . Previous cesarean delivery, desires repeat cesarean delivery 01/14/2014    Past Medical History  Diagnosis Date  . Ectopic pregnancy     Given MTX  . Gallstones     Past Surgical History  Procedure Laterality Date  . Induced abortion    . Cesarean section    . Dilation and curettage of uterus      OB History  Gravida Para Term Preterm AB SAB TAB Ectopic Multiple Living  4 1 1  2  1 1  1     # Outcome Date GA Lbr Len/2nd Weight Sex Delivery Anes PTL Lv  4 CUR           3 TRM           2 TAB           1 ECT               History   Social History  . Marital Status: Single    Spouse Name: N/A    Number of Children: N/A  . Years of Education: N/A   Social History Main Topics  . Smoking status: Never Smoker   . Smokeless tobacco: Never Used  . Alcohol Use: No  . Drug Use: No  . Sexual Activity: Yes    Birth Control/ Protection: None   Other Topics Concern  . None   Social History Narrative  . None    History reviewed. No pertinent family history.  No Known Allergies  Prescriptions prior to admission   Medication Sig Dispense Refill  . acetaminophen (TYLENOL) 325 MG tablet Take 650 mg by mouth every 6 (six) hours as needed.      . Prenatal Vit-Fe Fumarate-FA (PRENATAL MULTIVITAMIN) TABS tablet Take 1 tablet by mouth daily at 12 noon.        Review of Systems - Negative except for what is the HPI  Vitals:  BP 125/78  Pulse 129  Temp(Src) 100.2 F (37.9 C) (Oral)  Resp 28  Ht 5\' 2"  (1.575 m)  Wt 179 lb 9.6 oz (81.466 kg)  BMI 32.84 kg/m2  SpO2 98%  LMP 05/04/2013 Physical Examination: GENERAL: Well-developed, well-nourished female in no acute distress.  LUNGS: Clear to auscultation bilaterally.  HEART: Regular rate and rhythm. ABDOMEN: Soft, mild suprapubic tenderness, nondistended, gravid BACK: No CVAT appreciated, diffuse lower back pain EXTREMITIES: No cyanosis, clubbing, or edema, 2+ distal pulses with DTRs 2+ on the bilateral CERVIX:  1.5/30/posterior/ -2 Membranes:intact Fetal Monitoring:Baseline: 145 bpm, Variability: Moderate, Accelerations: Reactive and Decelerations: Absent Tocometer: Irregular, every 2-5 minutes  Labs:  Results for orders placed during the hospital encounter of 01/14/14 (from the past 24 hour(s))  URINALYSIS, ROUTINE W REFLEX MICROSCOPIC   Collection Time    01/14/14  4:05 PM      Result Value Ref Range   Color, Urine YELLOW  YELLOW   APPearance CLEAR  CLEAR   Specific Gravity, Urine 1.015  1.005 - 1.030   pH 6.0  5.0 - 8.0   Glucose, UA NEGATIVE  NEGATIVE mg/dL   Hgb urine dipstick NEGATIVE  NEGATIVE   Bilirubin Urine NEGATIVE  NEGATIVE   Ketones, ur >80 (*) NEGATIVE mg/dL   Protein, ur NEGATIVE  NEGATIVE mg/dL   Urobilinogen, UA 4.0 (*) 0.0 - 1.0 mg/dL   Nitrite POSITIVE (*) NEGATIVE   Leukocytes, UA MODERATE (*) NEGATIVE  URINE MICROSCOPIC-ADD ON   Collection Time    01/14/14  4:05 PM      Result Value Ref Range   Squamous Epithelial / LPF FEW (*) RARE   WBC, UA 11-20  <3 WBC/hpf   Bacteria, UA MANY (*) RARE    Assessment and  Plan: Patient Active Problem List   Diagnosis Date Noted  . Pyelonephritis complicating pregnancy in third trimester, antepartum 01/14/2014  . Previous cesarean delivery, desires repeat cesarean delivery 01/14/2014  Admit to Antenatal Ceftriaxone for presumed complicated UTI/pyelonephritis Magnesium sulfate for CP prophylaxis Patient desires repeat cesarean section if delivery indicated Routine antenatal care  Jaynie Collins, MD, FACOG Attending Obstetrician & Gynecologist Faculty Practice, [facility]

## 2014-01-14 NOTE — MAU Provider Note (Signed)
Please refer to H & P for admission details.  Gina CollinsUGONNA  Maudry Zeidan, MD, FACOG Attending Obstetrician & Gynecologist Faculty Practice, [facility]

## 2014-01-14 NOTE — MAU Note (Signed)
Pt reports having ctx for 2 days. Now ctx 2-4 min. Sent from Doctors office (health dept) 2cm dilated. Denies andy bleeding or leaking and reports  fetal movement not as active as usual/

## 2014-01-15 MED ORDER — POTASSIUM CHLORIDE CRYS ER 20 MEQ PO TBCR
40.0000 meq | EXTENDED_RELEASE_TABLET | Freq: Two times a day (BID) | ORAL | Status: DC
  Administered 2014-01-15: 40 meq via ORAL
  Filled 2014-01-15 (×2): qty 2

## 2014-01-15 NOTE — Progress Notes (Signed)
Patient ID: Gina DraftsShaquae D Truman, female   DOB: 06-03-1991, 23 y.o.   MRN: [id number] FACULTY PRACTICE ANTEPARTUM(COMPREHENSIVE) NOTE  Gina Bruce is a 23 y.o. [id number] at 3651w4d by best clinical estimate who is admitted for pyelonephritis.   Fetal presentation is cephalic. Length of Stay:  1  Days  Subjective: Complains of lower abdominal pain and contractions Patient reports the fetal movement as active. Patient reports uterine contraction  activity as regular, every 5 minutes. Patient reports  vaginal bleeding as none. Patient describes fluid per vagina as None.  Vitals:  Blood pressure 101/49, pulse 122, temperature 98.3 F (36.8 C), temperature source Oral, resp. rate 18, height 5\' 2"  (1.575 m), weight 179 lb (81.194 kg), last menstrual period 05/04/2013, SpO2 95.00%. Physical Examination:  General appearance - alert, well appearing, and in no distress Abdomen - soft, nontender, nondistended, no masses or organomegaly Fundal Height:  size equals dates Pelvic Exam:  normal external genitalia, vulva, vagina, cervix, uterus and adnexa Cervical Exam: Position: posterior, Dilation: 1cm, Thickness: 2 cm and Consistency: soft and found to be -3 and fetal presentation is cephalic. Extremities: extremities normal, atraumatic, no cyanosis or edema  Membranes:intact  Fetal Monitoring:  Baseline: 130 bpm, Variability: Good {> 6 bpm), Accelerations: Reactive and Decelerations: Absent  Labs:  Results for orders placed during the hospital encounter of 01/14/14 (from the past 24 hour(s))  URINALYSIS, ROUTINE W REFLEX MICROSCOPIC   Collection Time    01/14/14  4:05 PM      Result Value Ref Range   Color, Urine YELLOW  YELLOW   APPearance CLEAR  CLEAR   Specific Gravity, Urine 1.015  1.005 - 1.030   pH 6.0  5.0 - 8.0   Glucose, UA NEGATIVE  NEGATIVE mg/dL   Hgb urine dipstick NEGATIVE  NEGATIVE   Bilirubin Urine NEGATIVE  NEGATIVE   Ketones, ur >80 (*) NEGATIVE mg/dL   Protein, ur NEGATIVE   NEGATIVE mg/dL   Urobilinogen, UA 4.0 (*) 0.0 - 1.0 mg/dL   Nitrite POSITIVE (*) NEGATIVE   Leukocytes, UA MODERATE (*) NEGATIVE  URINE MICROSCOPIC-ADD ON   Collection Time    01/14/14  4:05 PM      Result Value Ref Range   Squamous Epithelial / LPF FEW (*) RARE   WBC, UA 11-20  <3 WBC/hpf   Bacteria, UA MANY (*) RARE  COMPREHENSIVE METABOLIC PANEL   Collection Time    01/14/14  4:46 PM      Result Value Ref Range   Sodium 138  137 - 147 mEq/L   Potassium 2.9 (*) 3.7 - 5.3 mEq/L   Chloride 102  96 - 112 mEq/L   CO2 21  19 - 32 mEq/L   Glucose, Bld 81  70 - 99 mg/dL   BUN <3 (*) 6 - 23 mg/dL   Creatinine, Ser 2.950.45 (*) 0.50 - 1.10 mg/dL   Calcium 9.0  8.4 - 62.110.5 mg/dL   Total Protein 6.1  6.0 - 8.3 g/dL   Albumin 2.5 (*) 3.5 - 5.2 g/dL   AST 13  0 - 37 U/L   ALT 8  0 - 35 U/L   Alkaline Phosphatase 87  39 - 117 U/L   Total Bilirubin 1.1  0.3 - 1.2 mg/dL   GFR calc non Af Amer >90  >90 mL/min   GFR calc Af Amer >90  >90 mL/min  CBC WITH DIFFERENTIAL   Collection Time    01/14/14  4:46 PM  Result Value Ref Range   WBC 16.6 (*) 4.0 - 10.5 K/uL   RBC 4.10  3.87 - 5.11 MIL/uL   Hemoglobin 9.8 (*) 12.0 - 15.0 g/dL   HCT 16.1 (*) 09.6 - 04.5 %   MCV 71.2 (*) 78.0 - 100.0 fL   MCH 23.9 (*) 26.0 - 34.0 pg   MCHC 33.6  30.0 - 36.0 g/dL   RDW 40.9 (*) 81.1 - 91.4 %   Platelets 246  150 - 400 K/uL   Neutrophils Relative % 74  43 - 77 %   Neutro Abs 12.3 (*) 1.7 - 7.7 K/uL   Lymphocytes Relative 7 (*) 12 - 46 %   Lymphs Abs 1.1  0.7 - 4.0 K/uL   Monocytes Relative 19 (*) 3 - 12 %   Monocytes Absolute 3.1 (*) 0.1 - 1.0 K/uL   Eosinophils Relative 0  0 - 5 %   Eosinophils Absolute 0.0  0.0 - 0.7 K/uL   Basophils Relative 0  0 - 1 %   Basophils Absolute 0.0  0.0 - 0.1 K/uL  RPR   Collection Time    01/14/14  4:46 PM      Result Value Ref Range   RPR NON REACTIVE  NON REACTIVE  TYPE AND SCREEN   Collection Time    01/14/14  4:46 PM      Result Value Ref Range    ABO/RH(D) A POS     Antibody Screen NEG     Sample Expiration 01/17/2014    ABO/RH   Collection Time    01/14/14  4:46 PM      Result Value Ref Range   ABO/RH(D) A POS        Medications:  Scheduled . cefTRIAXone (ROCEPHIN)  IV  1 g Intravenous Q12H  . docusate sodium  100 mg Oral Daily  . potassium chloride  20 mEq Oral BID  . prenatal multivitamin  1 tablet Oral Q1200   I have reviewed the patient's current medications.  ASSESSMENT: Patient Active Problem List   Diagnosis Date Noted  . Pyelonephritis complicating pregnancy in third trimester, antepartum 01/14/2014  . Previous cesarean delivery, desires repeat cesarean delivery 01/14/2014    PLAN: Inpt. Management for pyelo.  Await urine culture.  Ensure no labor.  Kessa Fairbairn S 01/15/2014,9:27 AM

## 2014-01-15 NOTE — Progress Notes (Signed)
UR completed 

## 2014-01-15 NOTE — Progress Notes (Signed)
Pt. Notified we need to collect a urine culture. States she doesn't have to void at present

## 2014-01-16 DIAGNOSIS — N12 Tubulo-interstitial nephritis, not specified as acute or chronic: Secondary | ICD-10-CM

## 2014-01-16 DIAGNOSIS — O34219 Maternal care for unspecified type scar from previous cesarean delivery: Secondary | ICD-10-CM

## 2014-01-16 DIAGNOSIS — O2390 Unspecified genitourinary tract infection in pregnancy, unspecified trimester: Secondary | ICD-10-CM

## 2014-01-16 DIAGNOSIS — O4700 False labor before 37 completed weeks of gestation, unspecified trimester: Secondary | ICD-10-CM

## 2014-01-16 LAB — POTASSIUM: POTASSIUM: 3.6 meq/L — AB (ref 3.7–5.3)

## 2014-01-16 MED ORDER — NITROFURANTOIN MACROCRYSTAL 100 MG PO CAPS: 100.0000 mg | ORAL_CAPSULE | Freq: Every day | ORAL | Status: DC

## 2014-01-16 MED ORDER — CEPHALEXIN 500 MG PO CAPS: 500.0000 mg | ORAL_CAPSULE | Freq: Four times a day (QID) | ORAL | Status: DC

## 2014-01-16 NOTE — Discharge Instructions (Signed)
Pyelonephritis, Adult °Pyelonephritis is a kidney infection. A kidney infection can happen quickly, or it can last for a long time. °HOME CARE  °· Take your medicine (antibiotics) as told. Finish it even if you start to feel better. °· Keep all doctor visits as told. °· Drink enough fluids to keep your pee (urine) clear or pale yellow. °· Only take medicine as told by your doctor. °GET HELP RIGHT AWAY IF:  °· You have a fever or lasting symptoms for more than 2-3 days. °· You have a fever and your symptoms suddenly get worse. °· You cannot take your medicine or drink fluids as told. °· You have chills and shaking. °· You feel very weak or pass out (faint). °· You do not feel better after 2 days. °MAKE SURE YOU: °· Understand these instructions. °· Will watch your condition. °· Will get help right away if you are not doing well or get worse. °Document Released: 12/27/2004 Document Revised: 05/20/2012 Document Reviewed: 05/09/2011 °ExitCare® Patient Information ©2014 [employer]. ° °

## 2014-01-16 NOTE — Discharge Summary (Signed)
Obstetric Discharge Summary Reason for Admission: History of Present Illness: History of Present Illness:  Gina Bruce is a 23 y.o. [id number] at 3420w3d admitted for presumed complicated UTI/pyelonephritis. She was [facility] for evaluation for labor, she was not found to be in labor but complained about back pain. UA showed nitrites and many bacteria, temp in MAU was 100.2. The decision was made to admit her for treatment and observation.  Prior for this episode pt had + urine culture E coli S all antibiotics except ampicillin Culture for this hospitalization pending at time of discharge Prenatal Procedures:  Intrapartum Procedures: n/a Postpartum Procedures:  Complications-Operative and Postpartum: none Hemoglobin  Date Value Ref Range Status  01/14/2014 9.8* 12.0 - 15.0 g/dL Final     HCT  Date Value Ref Range Status  01/14/2014 29.2* 36.0 - 46.0 % Final    Physical Exam:  General: alert, cooperative and no distress DVT Evaluation: No evidence of DVT seen on physical exam. No cvat at discharge, only round ligament discomforts Discharge Diagnoses: pregnancy 37 wk, complicated uti vs pyelo, responding to tx  Discharge Information: Date: 01/16/2014 Activity: unrestricted Diet: routine Medications: keflex x 7 d  Then Macrodantin Condition: stable Instructions: refer to practice specific booklet Discharge to: home Follow-up Information   [facility]. (for routine  pregnancy visit)    Specialty:  Home Health Services   Contact information:   Care Coordination for [facility] [address] [place] [address] [phone]       Newborn Data: <<This patient has not yet delivered during this pregnancy.>>   Festus Pursel V 01/16/2014, 9:38 AM

## 2014-01-17 LAB — CULTURE, OB URINE
CULTURE: NO GROWTH
Colony Count: NO GROWTH

## 2014-01-19 ENCOUNTER — Encounter (HOSPITAL_COMMUNITY): Payer: Self-pay | Admitting: Pharmacist

## 2014-01-28 ENCOUNTER — Encounter (HOSPITAL_COMMUNITY): Payer: Self-pay | Admitting: Obstetrics & Gynecology

## 2014-01-29 ENCOUNTER — Encounter (HOSPITAL_COMMUNITY)
Admission: RE | Admit: 2014-01-29 | Discharge: 2014-01-29 | Disposition: A | Discharge status: Home / Self Care | Payer: Medicaid Other | Source: Ambulatory Visit | Attending: Obstetrics & Gynecology | Admitting: Obstetrics & Gynecology

## 2014-01-29 ENCOUNTER — Encounter (HOSPITAL_COMMUNITY): Payer: Self-pay

## 2014-01-29 VITALS — BP 105/69 | HR 107 | Temp 97.5°F | Resp 16 | Ht 63.0 in

## 2014-01-29 DIAGNOSIS — Z01812 Encounter for preprocedural laboratory examination: Secondary | ICD-10-CM

## 2014-01-29 DIAGNOSIS — O34219 Maternal care for unspecified type scar from previous cesarean delivery: Secondary | ICD-10-CM

## 2014-01-29 LAB — CBC
HCT: 35.5 % — ABNORMAL LOW (ref 36.0–46.0)
Hemoglobin: 11.9 g/dL — ABNORMAL LOW (ref 12.0–15.0)
MCH: 23.7 pg — AB (ref 26.0–34.0)
MCHC: 33.5 g/dL (ref 30.0–36.0)
MCV: 70.7 fL — AB (ref 78.0–100.0)
PLATELETS: 356 10*3/uL (ref 150–400)
RBC: 5.02 MIL/uL (ref 3.87–5.11)
RDW: 17.3 % — AB (ref 11.5–15.5)
WBC: 10.7 10*3/uL — AB (ref 4.0–10.5)

## 2014-01-29 LAB — RPR: RPR Ser Ql: NONREACTIVE

## 2014-01-29 NOTE — Patient Instructions (Signed)
Your procedure is scheduled on: Monday, February 01, 2014  [facility] at: 10:00am  Pick up the phone at the desk and dial [phone].  Call this number if you have problems the morning of surgery: [phone].  Remember: Do NOT eat food: AFTER MIDNIGHT SUNDAY Do NOT drink clear liquids after: AFTER MIDNIGHT SUNDAY Take these medicines the morning of surgery with a SIP OF WATER: none  Do NOT wear jewelry (body piercing), make-up, or nail polish. Do NOT wear lotions, powders, or perfumes.  You may wear deoderant. Do NOT shave for 48 hours prior to surgery. Do NOT bring valuables to the hospital.  Leave suitcase in car.  After surgery it may be brought to your room.  For patients admitted to the hospital, checkout time is 11:00 AM the day of discharge.

## 2014-02-01 ENCOUNTER — Inpatient Hospital Stay (HOSPITAL_COMMUNITY)
Admission: RE | Admit: 2014-02-01 | Discharge: 2014-02-03 | DRG: 765 | Disposition: A | Discharge status: Home / Self Care | Payer: Medicaid Other | Source: Ambulatory Visit | Attending: Obstetrics & Gynecology | Admitting: Obstetrics & Gynecology

## 2014-02-01 ENCOUNTER — Encounter (HOSPITAL_COMMUNITY): Payer: Self-pay | Admitting: Anesthesiology

## 2014-02-01 ENCOUNTER — Encounter (HOSPITAL_COMMUNITY): Payer: Medicaid Other | Admitting: Anesthesiology

## 2014-02-01 ENCOUNTER — Encounter (HOSPITAL_COMMUNITY)
Admission: RE | Disposition: A | Discharge status: Home / Self Care | Payer: Self-pay | Source: Ambulatory Visit | Attending: Obstetrics & Gynecology

## 2014-02-01 ENCOUNTER — Inpatient Hospital Stay (HOSPITAL_COMMUNITY): Payer: Medicaid Other | Admitting: Anesthesiology

## 2014-02-01 DIAGNOSIS — O34219 Maternal care for unspecified type scar from previous cesarean delivery: Principal | ICD-10-CM | POA: Diagnosis present

## 2014-02-01 DIAGNOSIS — O26839 Pregnancy related renal disease, unspecified trimester: Secondary | ICD-10-CM | POA: Diagnosis present

## 2014-02-01 DIAGNOSIS — N289 Disorder of kidney and ureter, unspecified: Secondary | ICD-10-CM | POA: Diagnosis present

## 2014-02-01 DIAGNOSIS — Z98891 History of uterine scar from previous surgery: Secondary | ICD-10-CM

## 2014-02-01 LAB — PREPARE RBC (CROSSMATCH)

## 2014-02-01 SURGERY — Surgical Case
Anesthesia: Spinal | Site: Abdomen

## 2014-02-01 MED ORDER — ONDANSETRON HCL 4 MG/2ML IJ SOLN
INTRAMUSCULAR | Status: DC | PRN
  Administered 2014-02-01: 4 mg via INTRAVENOUS

## 2014-02-01 MED ORDER — LANOLIN HYDROUS EX OINT: 1.0000 "application " | TOPICAL_OINTMENT | CUTANEOUS | Status: DC | PRN

## 2014-02-01 MED ORDER — OXYTOCIN 40 UNITS IN LACTATED RINGERS INFUSION - SIMPLE MED: 62.5000 mL/h | INTRAVENOUS | Status: AC

## 2014-02-01 MED ORDER — MENTHOL 3 MG MT LOZG: 1.0000 | LOZENGE | OROMUCOSAL | Status: DC | PRN

## 2014-02-01 MED ORDER — NALBUPHINE HCL 10 MG/ML IJ SOLN
5.0000 mg | INTRAMUSCULAR | Status: DC | PRN
  Administered 2014-02-01: 10 mg via SUBCUTANEOUS
  Filled 2014-02-01: qty 1

## 2014-02-01 MED ORDER — FENTANYL CITRATE 0.05 MG/ML IJ SOLN: 25.0000 ug | INTRAMUSCULAR | Status: DC | PRN

## 2014-02-01 MED ORDER — METOCLOPRAMIDE HCL 5 MG/ML IJ SOLN: 10.0000 mg | Freq: Once | INTRAMUSCULAR | Status: DC | PRN

## 2014-02-01 MED ORDER — ONDANSETRON HCL 4 MG PO TABS: 4.0000 mg | ORAL_TABLET | ORAL | Status: DC | PRN

## 2014-02-01 MED ORDER — SCOPOLAMINE 1 MG/3DAYS TD PT72
MEDICATED_PATCH | TRANSDERMAL | Status: AC
  Administered 2014-02-01: 1.5 mg via TRANSDERMAL
  Filled 2014-02-01: qty 1

## 2014-02-01 MED ORDER — FENTANYL CITRATE 0.05 MG/ML IJ SOLN
INTRAMUSCULAR | Status: AC
  Filled 2014-02-01: qty 2

## 2014-02-01 MED ORDER — DIPHENHYDRAMINE HCL 50 MG/ML IJ SOLN: 12.5000 mg | INTRAMUSCULAR | Status: DC | PRN

## 2014-02-01 MED ORDER — PHENYLEPHRINE 8 MG IN D5W 100 ML (0.08MG/ML) PREMIX OPTIME
INJECTION | INTRAVENOUS | Status: AC
  Filled 2014-02-01: qty 100

## 2014-02-01 MED ORDER — SIMETHICONE 80 MG PO CHEW
80.0000 mg | CHEWABLE_TABLET | ORAL | Status: DC | PRN
  Administered 2014-02-03: 80 mg via ORAL

## 2014-02-01 MED ORDER — OXYTOCIN 10 UNIT/ML IJ SOLN
INTRAMUSCULAR | Status: AC
  Filled 2014-02-01: qty 4

## 2014-02-01 MED ORDER — LACTATED RINGERS IV SOLN
INTRAVENOUS | Status: DC
  Administered 2014-02-01: 23:00:00 via INTRAVENOUS

## 2014-02-01 MED ORDER — SCOPOLAMINE 1 MG/3DAYS TD PT72
1.0000 | MEDICATED_PATCH | Freq: Once | TRANSDERMAL | Status: DC
  Administered 2014-02-01: 1.5 mg via TRANSDERMAL

## 2014-02-01 MED ORDER — ONDANSETRON HCL 4 MG/2ML IJ SOLN: 4.0000 mg | INTRAMUSCULAR | Status: DC | PRN

## 2014-02-01 MED ORDER — DIPHENHYDRAMINE HCL 25 MG PO CAPS
25.0000 mg | ORAL_CAPSULE | ORAL | Status: DC | PRN
  Administered 2014-02-02: 25 mg via ORAL
  Filled 2014-02-01 (×2): qty 1

## 2014-02-01 MED ORDER — KETOROLAC TROMETHAMINE 60 MG/2ML IM SOLN
INTRAMUSCULAR | Status: AC
  Administered 2014-02-01: 60 mg via INTRAMUSCULAR
  Filled 2014-02-01: qty 2

## 2014-02-01 MED ORDER — MORPHINE SULFATE (PF) 0.5 MG/ML IJ SOLN
INTRAMUSCULAR | Status: DC | PRN
  Administered 2014-02-01: .15 mg via INTRATHECAL

## 2014-02-01 MED ORDER — PRENATAL MULTIVITAMIN CH
1.0000 | ORAL_TABLET | Freq: Every day | ORAL | Status: DC
  Administered 2014-02-02 – 2014-02-03 (×2): 1 via ORAL
  Filled 2014-02-01 (×2): qty 1

## 2014-02-01 MED ORDER — DIPHENHYDRAMINE HCL 50 MG/ML IJ SOLN: 25.0000 mg | INTRAMUSCULAR | Status: DC | PRN

## 2014-02-01 MED ORDER — BUPIVACAINE HCL (PF) 0.5 % IJ SOLN
INTRAMUSCULAR | Status: DC | PRN
  Administered 2014-02-01: 30 mL

## 2014-02-01 MED ORDER — LACTATED RINGERS IV SOLN
INTRAVENOUS | Status: DC
  Administered 2014-02-01: 11:00:00 via INTRAVENOUS

## 2014-02-01 MED ORDER — FENTANYL CITRATE 0.05 MG/ML IJ SOLN
INTRAMUSCULAR | Status: DC | PRN
  Administered 2014-02-01: 25 ug via INTRATHECAL

## 2014-02-01 MED ORDER — SCOPOLAMINE 1 MG/3DAYS TD PT72: 1.0000 | MEDICATED_PATCH | Freq: Once | TRANSDERMAL | Status: DC

## 2014-02-01 MED ORDER — LACTATED RINGERS IV SOLN
INTRAVENOUS | Status: DC
  Administered 2014-02-01 (×2): via INTRAVENOUS

## 2014-02-01 MED ORDER — NALOXONE HCL 0.4 MG/ML IJ SOLN: 0.4000 mg | INTRAMUSCULAR | Status: DC | PRN

## 2014-02-01 MED ORDER — SIMETHICONE 80 MG PO CHEW
80.0000 mg | CHEWABLE_TABLET | ORAL | Status: DC
Start: 2014-02-02 — End: 2014-02-03
  Administered 2014-02-02 (×2): 80 mg via ORAL
  Filled 2014-02-01 (×2): qty 1

## 2014-02-01 MED ORDER — CEFAZOLIN SODIUM-DEXTROSE 2-3 GM-% IV SOLR
2.0000 g | INTRAVENOUS | Status: AC
  Administered 2014-02-01: 2 g via INTRAVENOUS

## 2014-02-01 MED ORDER — PHENYLEPHRINE 8 MG IN D5W 100 ML (0.08MG/ML) PREMIX OPTIME
INJECTION | INTRAVENOUS | Status: DC | PRN
  Administered 2014-02-01: 60 ug/min via INTRAVENOUS

## 2014-02-01 MED ORDER — BUPIVACAINE HCL (PF) 0.5 % IJ SOLN
INTRAMUSCULAR | Status: AC
  Filled 2014-02-01: qty 30

## 2014-02-01 MED ORDER — KETOROLAC TROMETHAMINE 30 MG/ML IJ SOLN: 30.0000 mg | Freq: Four times a day (QID) | INTRAMUSCULAR | Status: AC | PRN

## 2014-02-01 MED ORDER — ZOLPIDEM TARTRATE 5 MG PO TABS: 5.0000 mg | ORAL_TABLET | Freq: Every evening | ORAL | Status: DC | PRN

## 2014-02-01 MED ORDER — MORPHINE SULFATE 0.5 MG/ML IJ SOLN
INTRAMUSCULAR | Status: AC
  Filled 2014-02-01: qty 10

## 2014-02-01 MED ORDER — LACTATED RINGERS IV SOLN
INTRAVENOUS | Status: DC | PRN
  Administered 2014-02-01: 12:00:00 via INTRAVENOUS

## 2014-02-01 MED ORDER — IBUPROFEN 600 MG PO TABS
600.0000 mg | ORAL_TABLET | Freq: Four times a day (QID) | ORAL | Status: DC
  Administered 2014-02-02 – 2014-02-03 (×6): 600 mg via ORAL
  Filled 2014-02-01 (×6): qty 1

## 2014-02-01 MED ORDER — ONDANSETRON HCL 4 MG/2ML IJ SOLN: 4.0000 mg | Freq: Three times a day (TID) | INTRAMUSCULAR | Status: DC | PRN

## 2014-02-01 MED ORDER — KETOROLAC TROMETHAMINE 60 MG/2ML IM SOLN
60.0000 mg | Freq: Once | INTRAMUSCULAR | Status: AC | PRN
  Administered 2014-02-01: 60 mg via INTRAMUSCULAR

## 2014-02-01 MED ORDER — NALOXONE HCL 1 MG/ML IJ SOLN
1.0000 ug/kg/h | INTRAMUSCULAR | Status: DC | PRN
  Filled 2014-02-01: qty 2

## 2014-02-01 MED ORDER — SIMETHICONE 80 MG PO CHEW
80.0000 mg | CHEWABLE_TABLET | Freq: Three times a day (TID) | ORAL | Status: DC
  Administered 2014-02-02 – 2014-02-03 (×5): 80 mg via ORAL
  Filled 2014-02-01 (×6): qty 1

## 2014-02-01 MED ORDER — ONDANSETRON HCL 4 MG/2ML IJ SOLN
INTRAMUSCULAR | Status: AC
  Filled 2014-02-01: qty 2

## 2014-02-01 MED ORDER — CEFAZOLIN SODIUM-DEXTROSE 2-3 GM-% IV SOLR
INTRAVENOUS | Status: AC
  Filled 2014-02-01: qty 50

## 2014-02-01 MED ORDER — KETOROLAC TROMETHAMINE 30 MG/ML IJ SOLN
30.0000 mg | Freq: Four times a day (QID) | INTRAMUSCULAR | Status: AC | PRN
  Administered 2014-02-01: 30 mg via INTRAVENOUS
  Filled 2014-02-01: qty 1

## 2014-02-01 MED ORDER — WITCH HAZEL-GLYCERIN EX PADS: 1.0000 "application " | MEDICATED_PAD | CUTANEOUS | Status: DC | PRN

## 2014-02-01 MED ORDER — SENNOSIDES-DOCUSATE SODIUM 8.6-50 MG PO TABS
2.0000 | ORAL_TABLET | ORAL | Status: DC
  Administered 2014-02-02 (×2): 2 via ORAL
  Filled 2014-02-01 (×2): qty 2

## 2014-02-01 MED ORDER — SODIUM CHLORIDE 0.9 % IJ SOLN: 3.0000 mL | INTRAMUSCULAR | Status: DC | PRN

## 2014-02-01 MED ORDER — NALBUPHINE HCL 10 MG/ML IJ SOLN
5.0000 mg | INTRAMUSCULAR | Status: DC | PRN
  Filled 2014-02-01: qty 1

## 2014-02-01 MED ORDER — MAGNESIUM HYDROXIDE 400 MG/5ML PO SUSP: 30.0000 mL | ORAL | Status: DC | PRN

## 2014-02-01 MED ORDER — OXYCODONE-ACETAMINOPHEN 5-325 MG PO TABS
1.0000 | ORAL_TABLET | ORAL | Status: DC | PRN
  Administered 2014-02-02 – 2014-02-03 (×4): 1 via ORAL
  Filled 2014-02-01 (×4): qty 1

## 2014-02-01 MED ORDER — MEPERIDINE HCL 25 MG/ML IJ SOLN: 6.2500 mg | INTRAMUSCULAR | Status: DC | PRN

## 2014-02-01 MED ORDER — OXYTOCIN 10 UNIT/ML IJ SOLN
40.0000 [IU] | INTRAVENOUS | Status: DC | PRN
  Administered 2014-02-01: 40 [IU] via INTRAVENOUS

## 2014-02-01 MED ORDER — DIPHENHYDRAMINE HCL 25 MG PO CAPS: 25.0000 mg | ORAL_CAPSULE | Freq: Four times a day (QID) | ORAL | Status: DC | PRN

## 2014-02-01 MED ORDER — TETANUS-DIPHTH-ACELL PERTUSSIS 5-2.5-18.5 LF-MCG/0.5 IM SUSP: 0.5000 mL | Freq: Once | INTRAMUSCULAR | Status: DC

## 2014-02-01 MED ORDER — DIBUCAINE 1 % RE OINT: 1.0000 "application " | TOPICAL_OINTMENT | RECTAL | Status: DC | PRN

## 2014-02-01 MED ORDER — METOCLOPRAMIDE HCL 5 MG/ML IJ SOLN: 10.0000 mg | Freq: Three times a day (TID) | INTRAMUSCULAR | Status: DC | PRN

## 2014-02-01 MED ORDER — MEASLES, MUMPS & RUBELLA VAC ~~LOC~~ INJ
0.5000 mL | INJECTION | Freq: Once | SUBCUTANEOUS | Status: DC
  Filled 2014-02-01: qty 0.5

## 2014-02-01 SURGICAL SUPPLY — 42 items
BENZOIN TINCTURE PRP APPL 2/3 (GAUZE/BANDAGES/DRESSINGS) ×3 IMPLANT
BINDER ABD UNIV 10 28-50 (GAUZE/BANDAGES/DRESSINGS) ×1 IMPLANT
BINDER ABD UNIV 12 45-62 (WOUND CARE) IMPLANT
BINDER ABDOM UNIV 10 (GAUZE/BANDAGES/DRESSINGS) ×3
BINDER ABDOMINAL 46IN 62IN (WOUND CARE)
CLAMP CORD UMBIL (MISCELLANEOUS) IMPLANT
CLOSURE WOUND 1/2 X4 (GAUZE/BANDAGES/DRESSINGS) ×1
CLOTH BEACON ORANGE TIMEOUT ST (SAFETY) ×3 IMPLANT
DRAPE LG THREE QUARTER DISP (DRAPES) IMPLANT
DRSG OPSITE POSTOP 4X10 (GAUZE/BANDAGES/DRESSINGS) ×3 IMPLANT
DURAPREP 26ML APPLICATOR (WOUND CARE) ×3 IMPLANT
ELECT REM PT RETURN 9FT ADLT (ELECTROSURGICAL) ×3
ELECTRODE REM PT RTRN 9FT ADLT (ELECTROSURGICAL) ×1 IMPLANT
EXTRACTOR VACUUM KIWI (MISCELLANEOUS) IMPLANT
GLOVE BIO SURGEON STRL SZ7 (GLOVE) ×6 IMPLANT
GLOVE BIOGEL PI IND STRL 7.0 (GLOVE) ×2 IMPLANT
GLOVE BIOGEL PI INDICATOR 7.0 (GLOVE) ×4
GOWN STRL REUS W/ TWL XL LVL3 (GOWN DISPOSABLE) ×1 IMPLANT
GOWN STRL REUS W/TWL LRG LVL3 (GOWN DISPOSABLE) ×9 IMPLANT
GOWN STRL REUS W/TWL XL LVL3 (GOWN DISPOSABLE) ×2
KIT ABG SYR 3ML LUER SLIP (SYRINGE) IMPLANT
NEEDLE HYPO 22GX1.5 SAFETY (NEEDLE) ×3 IMPLANT
NEEDLE HYPO 25X5/8 SAFETYGLIDE (NEEDLE) IMPLANT
NS IRRIG 1000ML POUR BTL (IV SOLUTION) ×3 IMPLANT
PACK C SECTION WH (CUSTOM PROCEDURE TRAY) ×3 IMPLANT
PAD OB MATERNITY 4.3X12.25 (PERSONAL CARE ITEMS) ×3 IMPLANT
RTRCTR C-SECT PINK 25CM LRG (MISCELLANEOUS) IMPLANT
SPONGE LAP 18X18 X RAY DECT (DISPOSABLE) ×3 IMPLANT
SPONGE SURGIFOAM ABS GEL 12-7 (HEMOSTASIS) IMPLANT
STAPLER VISISTAT 35W (STAPLE) IMPLANT
STRIP CLOSURE SKIN 1/2X4 (GAUZE/BANDAGES/DRESSINGS) ×2 IMPLANT
SUT PDS AB 0 CTX 60 (SUTURE) IMPLANT
SUT PLAIN 0 NONE (SUTURE) IMPLANT
SUT SILK 0 TIES 10X30 (SUTURE) IMPLANT
SUT VIC AB 0 CT1 36 (SUTURE) ×9 IMPLANT
SUT VIC AB 3-0 CT1 27 (SUTURE) ×2
SUT VIC AB 3-0 CT1 TAPERPNT 27 (SUTURE) ×1 IMPLANT
SUT VIC AB 4-0 KS 27 (SUTURE) IMPLANT
SYR CONTROL 10ML LL (SYRINGE) ×3 IMPLANT
TOWEL OR 17X24 6PK STRL BLUE (TOWEL DISPOSABLE) ×3 IMPLANT
TRAY FOLEY CATH 14FR (SET/KITS/TRAYS/PACK) ×3 IMPLANT
WATER STERILE IRR 1000ML POUR (IV SOLUTION) ×3 IMPLANT

## 2014-02-01 NOTE — Anesthesia Postprocedure Evaluation (Signed)
  Anesthesia Post-op Note  Anesthesia Post Note  Patient: Gina Bruce  Procedure(s) Performed: Procedure(s) (LRB): REPEAT CESAREAN SECTION X 1 (N/A)  Anesthesia type: Spinal  Patient location: Mother/Baby  Post pain: Pain level controlled  Post assessment: Post-op Vital signs reviewed  Last Vitals:  Filed Vitals:   02/01/14 1549  BP: 108/54  Pulse: 86  Temp: 36.9 C  Resp:     Post vital signs: Reviewed  Level of consciousness: awake  Complications: No apparent anesthesia complications

## 2014-02-01 NOTE — Op Note (Signed)
I was present for entire procedure.  There were no complications. Attestation of Attending Supervision of Fellow: Evaluation and management procedures were performed by the Fellow under my supervision and collaboration.  I have reviewed the Fellow's note and chart, and I agree with the management and plan.

## 2014-02-01 NOTE — H&P (Signed)
Vonna DraftsShaquae D Plotts is a 23 y.o. female presenting for repeat cesarean section.  History OB History   Grav Para Term Preterm Abortions TAB SAB Ect Mult Living   4 1 1  2 1  1  1      Past Medical History  Diagnosis Date  . Ectopic pregnancy     Given MTX  . Gallstones    Past Surgical History  Procedure Laterality Date  . Induced abortion    . Cesarean section    . Dilation and curettage of uterus     Family History: family history is not on file. Social History:  reports that she has never smoked. She has never used smokeless tobacco. She reports that she does not drink alcohol or use illicit drugs.   Prenatal Transfer Tool  Maternal Diabetes: No Genetic Screening: Normal Maternal Ultrasounds/Referrals: Normal Fetal Ultrasounds or other Referrals:  None Maternal Substance Abuse:  No Significant Maternal Medications:  Meds include: Other: atbx for UTI Significant Maternal Lab Results:  Lab values include: Other:  persistent e-coli in urine Other Comments:  h/o recurrent UTI's  ROS    Blood pressure 107/65, pulse 124, temperature 98.4 F (36.9 C), temperature source Oral, resp. rate 16, last menstrual period 05/04/2013, SpO2 99.00%. Exam Pt in NAD Physical Exam  Lungs: CTA CV:RRR Abd: gravid; NT, well healed transverse incision  Prenatal labs: ABO, Rh: --/--/A POS (02/27 1150) Antibody: NEG (02/27 1150) Rubella:  immune RPR: NON REACTIVE (02/27 1150)  HBsAg:   neg HIV: Non-reactive (09/04 0000)  GBS:   neg CBC    Component Value Date/Time   WBC 10.7* 01/29/2014 1150   RBC 5.02 01/29/2014 1150   HGB 11.9* 01/29/2014 1150   HCT 35.5* 01/29/2014 1150   PLT 356 01/29/2014 1150   MCV 70.7* 01/29/2014 1150   MCH 23.7* 01/29/2014 1150   MCHC 33.5 01/29/2014 1150   RDW 17.3* 01/29/2014 1150   LYMPHSABS 1.1 01/14/2014 1646   MONOABS 3.1* 01/14/2014 1646   EOSABS 0.0 01/14/2014 1646   BASOSABS 0.0 01/14/2014 1646     Assessment/Plan: Patient desires surgical management  with repeat c-section.  The risks of surgery were discussed in detail with the patient including but not limited to: bleeding which may require transfusion or reoperation; infection which may require prolonged hospitalization or re-hospitalization and antibiotic therapy; injury to bowel, bladder, ureters and major vessels or other surrounding organs; need for additional procedures including laparotomy; thromboembolic phenomenon, incisional problems and other postoperative or anesthesia complications.  Patient was told that the likelihood that her condition and symptoms will be treated effectively with this surgical management was very high; the postoperative expectations were also discussed in detail. The patient also understands the alternative treatment options which were discussed in full. All questions were answered.  HARRAWAY-SMITH, Doshie Maggi 02/01/2014, 10:23 AM

## 2014-02-01 NOTE — Anesthesia Procedure Notes (Addendum)
Spinal  Patient location during procedure: OR Preanesthetic Checklist Completed: patient identified, site marked, surgical consent, pre-op evaluation, timeout performed, IV checked, risks and benefits discussed and monitors and equipment checked Spinal Block Patient position: sitting Prep: DuraPrep Patient monitoring: heart rate, cardiac monitor, continuous pulse ox and blood pressure Approach: midline Location: L3-4 Injection technique: single-shot Needle Needle type: Sprotte  Needle gauge: 24 G Needle length: 9 cm Assessment Sensory level: T4 Additional Notes Required three passes of Sprotte thru subarachnoid membrane before there was (+) asp CSF( dripped but no free flow x2)  Spinal Dosage in OR  Bupivicaine ml       1.3 PFMS04   mcg        150 Fentanyl mcg            25

## 2014-02-01 NOTE — Addendum Note (Signed)
Addendum created 02/01/14 1618 by Turner DanielsJennifer L Becki Mccaskill, CRNA   Modules edited: Notes Section   Notes Section:  File: [id number]

## 2014-02-01 NOTE — Op Note (Signed)
Gina Bruce PROCEDURE DATE: 02/01/2014  PREOPERATIVE DIAGNOSIS: Intrauterine pregnancy at  1055w0d weeks gestation; elective repeat C/S at term  POSTOPERATIVE DIAGNOSIS: The same  PROCEDURE: Repeat Low Transverse Cesarean Section  SURGEON:  Dr. Eber Jonesarolyn L. Harraway-Smith  ASSISTANT: Dr. Rulon AbideKeli Samani Deal  INDICATIONS: Gina Bruce is a 23 y.o. [phone] at 7655w0d here for cesarean section secondary to the indications listed under preoperative diagnosis; please see preoperative note for further details.  The risks of cesarean section were discussed with the patient including but were not limited to: bleeding which may require transfusion or reoperation; infection which may require antibiotics; injury to bowel, bladder, ureters or other surrounding organs; injury to the fetus; need for additional procedures including hysterectomy in the event of a life-threatening hemorrhage; placental abnormalities wth subsequent pregnancies, incisional problems, thromboembolic phenomenon and other postoperative/anesthesia complications.   The patient concurred with the proposed plan, giving informed written consent for the procedure.    FINDINGS:  Viable female infant in cephalic presentation.  Apgars 9 and 9.  Clear amniotic fluid.  Intact placenta, three vessel cord.  Normal uterus, fallopian tubes and ovaries bilaterally.  ANESTHESIA: Spinal INTRAVENOUS FLUIDS: 2000 ml ESTIMATED BLOOD LOSS: 600 ml URINE OUTPUT:  200 ml SPECIMENS: Placenta sent to L&D COMPLICATIONS: None immediate  PROCEDURE IN DETAIL:  The patient preoperatively received intravenous antibiotics and had sequential compression devices applied to her lower extremities.  She was then taken to the operating room where spinal anesthesia was administered and was found to be adequate. She was then placed in a dorsal supine position with a leftward tilt, and prepped and draped in a sterile manner.  A foley catheter was placed into her bladder and attached  to constant gravity.  After an adequate timeout was performed, a Pfannenstiel skin incision was made above her previous keloid scar with scalpel and carried through to the underlying layer of fascia. The fascia was incised in the midline, and this incision was extended bilaterally using the Mayo scissors.  Kocher clamps were applied to the superior aspect of the fascial incision and the underlying rectus muscles were dissected off bluntly and with mayo scissors. A similar process was carried out on the inferior aspect of the fascial incision. The rectus muscles were separated in the midline bluntly and the peritoneum was entered bluntly. Attention was turned to the lower uterine segment where a low transverse hysterotomy incision was made with a scalpel and extended bilaterally bluntly.  Bandage scissors were used to extend the incision on the left side by 1 cm. The infant was successfully delivered, the cord was clamped and cut and the infant was handed over to awaiting neonatology team. Uterine massage was then administered, and the placenta delivered intact with a three-vessel cord. The uterus was then cleared of clot and debris.  The hysterotomy was closed with 0 Vicryl in a running locked fashion, and an imbricating layer was also placed with the same suture. A figure of 8 was used to close the small 1cm extension.  The pelvis was cleared of all clot and debris. Hemostasis was confirmed on all surfaces.  The peritoneum and the muscles were reapproximated using 0Vicryl in 1 interrupted suture. The fascia was then closed using 0 Vicryl in a running fashion.  30 cc of 0.5% marcaine was injected into the incision. The subcutaneous layer was irrigated, then reapproximated with 3-0 vicryl in a running locked fashion, and the skin was closed with a 4-0 Vicryl subcuticular stitch.  Benzoin and steristrips were  applied.  The patient tolerated the procedure well. Sponge, lap, instrument and needle counts were correct x  2.  She was taken to the recovery room in stable condition.   Lestat Golob, Redmond Baseman, MD

## 2014-02-01 NOTE — Consult Note (Signed)
Neonatology Note:  Attendance at C-section:  I was asked by Dr. Harraway-Smith to attend this repeat C/S at term. The mother is a G4P1A2 A pos, GBS neg with recurrent UTI. ROM at delivery, fluid clear. Infant vigorous with good spontaneous cry and tone. Needed only minimal bulb suctioning. Ap 9/9. Lungs clear to ausc in DR. 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 This was discussed with his mother in the DR. To CN to care of Pediatrician.  Gina Bruce C. [name], MD  

## 2014-02-01 NOTE — Progress Notes (Signed)
Pt with honeycomb dressing saturated, abdominal binder intact, Dr. Gemma PayorHarrawy smith notified, pressure dressing applied per order.

## 2014-02-01 NOTE — Transfer of Care (Signed)
Immediate Anesthesia Transfer of Care Note  Patient: Gina Bruce  Procedure(s) Performed: Procedure(s): REPEAT CESAREAN SECTION X 1 (N/A)  Patient Location: PACU  Anesthesia Type:Spinal  Level of Consciousness: awake, alert , oriented and patient cooperative  Airway & Oxygen Therapy: Patient Spontanous Breathing  Post-op Assessment: Report given to PACU RN and Post -op Vital signs reviewed and stable  Post vital signs: Reviewed and stable  Complications: No apparent anesthesia complications

## 2014-02-01 NOTE — Lactation Note (Signed)
This note was copied from the chart of Gina Jacquenette ShoneShaquae Ledford. Lactation Consultation Note Initial visit at 10 hours of age.  Mom reports a few good feedings, but she is working on positioning.  Discussed position options and encouraged pillow support.  Baby is STS on mom and asleep.  She reports just feeding him for 20 minutes and he fell asleep on her.  [facility] given and discussed.  Encouraged to feed with early cues and discussed feeding frequency.  Hand expresssion demonstrated with colostrum visible.  Mom to call for support as needed.    Patient Name: Gina Bruce EXBMW'UToday's Date: 02/01/2014 Reason for consult: Initial assessment   Maternal Data Has patient been taught Hand Expression?: Yes Does the patient have breastfeeding experience prior to this delivery?: No (24 year old child didn't latch well)  Feeding Feeding Type: Breast Fed Length of feed: 20 min  LATCH Score/Interventions                Intervention(s): Breastfeeding basics reviewed;Support Pillows;Position options;Skin to skin     Lactation Tools Discussed/Used     Consult Status Consult Status: Follow-up Date: 02/02/14 Follow-up type: In-patient    Jannifer RodneyShoptaw, [name] 02/01/2014, 10:07 PM

## 2014-02-01 NOTE — Anesthesia Preprocedure Evaluation (Addendum)
Anesthesia Evaluation  Patient identified by MRN, date of birth, ID band Patient awake    Reviewed: Allergy & Precautions, H&P , NPO status , Patient's Chart, lab work & pertinent test results, reviewed documented beta blocker date and time   History of Anesthesia Complications Negative for: history of anesthetic complications  Airway Mallampati: II TM Distance: >3 FB Neck ROM: full    Dental  (+) Teeth Intact   Pulmonary neg pulmonary ROS,  breath sounds clear to auscultation  Pulmonary exam normal       Cardiovascular negative cardio ROS  Rhythm:regular Rate:Normal     Neuro/Psych negative neurological ROS  negative psych ROS   GI/Hepatic negative GI ROS, Neg liver ROS,   Endo/Other  negative endocrine ROS  Renal/GU Renal disease (admitted for treatment of pyelonephritis 01/14/14, resolved)negative Renal ROS  negative genitourinary   Musculoskeletal   Abdominal Normal abdominal exam  (+)   Peds  Hematology negative hematology ROS (+)   Anesthesia Other Findings   Reproductive/Obstetrics (+) Pregnancy (h/o c/s x1 for repeat)                          Anesthesia Physical Anesthesia Plan  ASA: II  Anesthesia Plan: Spinal   Post-op Pain Management:    Induction:   Airway Management Planned:   Additional Equipment:   Intra-op Plan:   Post-operative Plan:   Informed Consent: I have reviewed the patients History and Physical, chart, labs and discussed the procedure including the risks, benefits and alternatives for the proposed anesthesia with the patient or authorized representative who has indicated his/her understanding and acceptance.     Plan Discussed with: Surgeon and CRNA  Anesthesia Plan Comments:         Anesthesia Quick Evaluation

## 2014-02-01 NOTE — Anesthesia Postprocedure Evaluation (Signed)
  Anesthesia Post-op Note  Patient: Gina Bruce  Procedure(s) Performed: Procedure(s): REPEAT CESAREAN SECTION X 1 (N/A)  Patient is awake, responsive, moving her legs, and has signs of resolution of her numbness. Pain and nausea are reasonably well controlled. Vital signs are stable and clinically acceptable. Oxygen saturation is clinically acceptable. There are no apparent anesthetic complications at this time. Patient is ready for discharge.

## 2014-02-02 ENCOUNTER — Encounter (HOSPITAL_COMMUNITY): Payer: Self-pay | Admitting: Obstetrics & Gynecology

## 2014-02-02 DIAGNOSIS — N289 Disorder of kidney and ureter, unspecified: Secondary | ICD-10-CM

## 2014-02-02 DIAGNOSIS — O34219 Maternal care for unspecified type scar from previous cesarean delivery: Secondary | ICD-10-CM

## 2014-02-02 DIAGNOSIS — O26839 Pregnancy related renal disease, unspecified trimester: Secondary | ICD-10-CM

## 2014-02-02 LAB — TYPE AND SCREEN
ABO/RH(D): A POS
Antibody Screen: NEGATIVE
Unit division: 0
Unit division: 0

## 2014-02-02 LAB — CBC
HCT: 32.9 % — ABNORMAL LOW (ref 36.0–46.0)
Hemoglobin: 10.9 g/dL — ABNORMAL LOW (ref 12.0–15.0)
MCH: 23.3 pg — ABNORMAL LOW (ref 26.0–34.0)
MCHC: 33.1 g/dL (ref 30.0–36.0)
MCV: 70.4 fL — ABNORMAL LOW (ref 78.0–100.0)
PLATELETS: 328 10*3/uL (ref 150–400)
RBC: 4.67 MIL/uL (ref 3.87–5.11)
RDW: 17.5 % — ABNORMAL HIGH (ref 11.5–15.5)
WBC: 13.2 10*3/uL — AB (ref 4.0–10.5)

## 2014-02-02 LAB — BIRTH TISSUE RECOVERY COLLECTION (PLACENTA DONATION)

## 2014-02-02 NOTE — Progress Notes (Signed)
UR chart review completed.  

## 2014-02-02 NOTE — Progress Notes (Signed)
Subjective: Postpartum Day 1: Cesarean Delivery Patient reports tolerating PO.  Just got foley removed. Not passing gas yet. Up moving around.   Objective: Vital signs in last 24 hours: Temp:  [97.7 F (36.5 C)-98.5 F (36.9 C)] 98.1 F (36.7 C) (03/03 0405) Pulse Rate:  [75-124] 108 (03/03 0405) Resp:  [16-21] 18 (03/03 0405) BP: (100-127)/(52-77) 109/77 mmHg (03/03 0405) SpO2:  [95 %-100 %] 95 % (03/03 0405) Weight:  [81.194 kg (179 lb)] 81.194 kg (179 lb) (03/02 1100)  Physical Exam:  General: alert, cooperative and no distress Lochia: appropriate Uterine Fundus: firm Incision: pressure dressing on with some old blood but no new areas of extravasation DVT Evaluation: No cords or calf tenderness. No significant calf/ankle edema.   Recent Labs  02/02/14 0648  HGB 10.9*  HCT 32.9*    Assessment/Plan: Status post Cesarean section. Doing well postoperatively.  Continue current care. Depo   Despina Boan L 02/02/2014, 7:58 AM

## 2014-02-03 ENCOUNTER — Ambulatory Visit: Payer: Self-pay

## 2014-02-03 MED ORDER — OXYCODONE-ACETAMINOPHEN 5-325 MG PO TABS: 1.0000 | ORAL_TABLET | ORAL | Status: DC | PRN

## 2014-02-03 MED ORDER — IBUPROFEN 600 MG PO TABS: 600.0000 mg | ORAL_TABLET | Freq: Four times a day (QID) | ORAL | Status: DC

## 2014-02-03 MED ORDER — DOCUSATE SODIUM 100 MG PO CAPS: 100.0000 mg | ORAL_CAPSULE | Freq: Two times a day (BID) | ORAL | Status: DC

## 2014-02-03 NOTE — Lactation Note (Signed)
This note was copied from the chart of Gina Jacquenette ShoneShaquae Deguia. Lactation Consultation Note  Patient Name: Gina Bruce ZOXWR'UToday's Date: 02/03/2014 Reason for consult: Follow-up assessment- Baby on single photo tx  2nd visit today, mom called for latch check, MBU RN went in 1st and LC joined  Assessment. Baby was already latched on left breast with depth , released after 7 mins , released for interval and relatched with assist for depth and positioning. Multiply swallows noted and fed for 30 mins, increased with breast compressions. Baby became non-nutritive so LC had mom release suction and baby woke up, rooting and relatched  On the right breast in football position. Multiply swallows noted and mom was independent with latch. LC just assisted with replacing the bili blanket. Multiply swallows noted , increased with breast compressions. Baby was feeding 7-8 mins and still feeding in a consistent pattern. Per mom comfortable.    Maternal Data    Feeding Feeding Type: Breast Fed (right breast ) Length of feed: 30 min (LC observed the latch which started at 1408 , )  LATCH Score/Interventions Latch: Grasps breast easily, tongue down, lips flanged, rhythmical sucking. Intervention(s): Adjust position;Assist with latch;Breast massage;Breast compression  Audible Swallowing: Spontaneous and intermittent (multiply swallows ) Intervention(s): Skin to skin Intervention(s): Skin to skin  Type of Nipple: Everted at rest and after stimulation  Comfort (Breast/Nipple): Soft / non-tender  Problem noted: Mild/Moderate discomfort Interventions (Mild/moderate discomfort): Hand massage;Hand expression  Hold (Positioning): Assistance needed to correctly position infant at breast and maintain latch. (positioning and depth assistance ) Intervention(s): Breastfeeding basics reviewed;Support Pillows;Position options;Skin to skin  LATCH Score: 9  Lactation Tools Discussed/Used Tools: Pump Breast pump  type: Double-Electric Breast Pump   Consult Status Consult Status: Follow-up Date: 02/04/14 Follow-up type: In-patient    Kathrin Greathouseorio, Kizzi Overbey Ann 02/03/2014, 3:31 PM

## 2014-02-03 NOTE — Discharge Instructions (Signed)
Postpartum Care After Cesarean Delivery °After you deliver your newborn (postpartum period), the usual stay in the hospital is 24 72 hours. If there were problems with your labor or delivery, or if you have other medical problems, you might be in the hospital longer.  °While you are in the hospital, you will receive help and instructions on how to care for yourself and your newborn during the postpartum period.  °While you are in the hospital: °· It is normal for you to have pain or discomfort from the incision in your abdomen. Be sure to tell your nurses when you are having pain, where the pain is located, and what makes the pain worse. °· If you are breastfeeding, you may feel uncomfortable contractions of your uterus for a couple of weeks. This is normal. The contractions help your uterus get back to normal size. °· It is normal to have some bleeding after delivery. °· For the first 1 3 days after delivery, the flow is red and the amount may be similar to a period. °· It is common for the flow to start and stop. °· In the first few days, you may pass some small clots. Let your nurses know if you begin to pass large clots or your flow increases. °· Do not  flush blood clots down the toilet before having the nurse look at them. °· During the next 3 10 days after delivery, your flow should become more watery and pink or brown-tinged in color. °· Ten to fourteen days after delivery, your flow should be a small amount of yellowish-white discharge. °· The amount of your flow will decrease over the first few weeks after delivery. Your flow may stop in 6 8 weeks. Most women have had their flow stop by 12 weeks after delivery. °· You should change your sanitary pads frequently. °· Wash your hands thoroughly with soap and water for at least 20 seconds after changing pads, using the toilet, or before holding or feeding your newborn. °· Your intravenous (IV) tubing will be removed when you are drinking enough fluids. °· The  urine drainage tube (urinary catheter) that was inserted before delivery may be removed within 6 8 hours after delivery or when feeling returns to your legs. You should feel like you need to empty your bladder within the first 6 8 hours after the catheter has been removed. °· In case you become weak, lightheaded, or faint, call your nurse before you get out of bed for the first time and before you take a shower for the first time. °· Within the first few days after delivery, your breasts may begin to feel tender and full. This is called engorgement. Breast tenderness usually goes away within 48 72 hours after engorgement occurs. You may also notice milk leaking from your breasts. If you are not breastfeeding, do not stimulate your breasts. Breast stimulation can make your breasts produce more milk. °· Spending as much time as possible with your newborn is very important. During this time, you and your newborn can feel close and get to know each other. Having your newborn stay in your room (rooming in) will help to strengthen the bond with your newborn. It will give you time to get to know your newborn and become comfortable caring for your newborn. °· Your hormones change after delivery. Sometimes the hormone changes can temporarily cause you to feel sad or tearful. These feelings should not last more than a few days. If these feelings last longer   than that, you should talk to your caregiver. °· If desired, talk to your caregiver about methods of family planning or contraception. °· Talk to your caregiver about immunizations. Your caregiver may want you to have the following immunizations before leaving the hospital: °· Tetanus, diphtheria, and pertussis (Tdap) or tetanus and diphtheria (Td) immunization. It is very important that you and your family (including grandparents) or others caring for your newborn are up-to-date with the Tdap or Td immunizations. The Tdap or Td immunization can help protect your newborn  from getting ill. °· Rubella immunization. °· Varicella (chickenpox) immunization. °· Influenza immunization. You should receive this annual immunization if you did not receive the immunization during your pregnancy. °Document Released: 08/13/2012 Document Reviewed: 08/13/2012 °[employer]® Patient Information ©2014 [employer]. ° °

## 2014-02-03 NOTE — Discharge Summary (Signed)
  Obstetric Discharge Summary Reason for Admission: cesarean section Prenatal Procedures: none Intrapartum Procedures: cesarean: low cervical, transverse Postpartum Procedures: none Complications-Operative and Postpartum: none  Hospital Course:  Pt admitted for repeat c-section. Did well operatively, see op note. PP patient met milestones. Desires mirena for contraception and is breast feeding. Pt was stable for discharge home on POD#2.  Filed Vitals:   02/03/14 0600  BP: 107/69  Pulse: 79  Temp: 97.8 F (36.6 C)  Resp: 18   VSS NAD Appropriate tenderness C/D/i incision Appropriate lochia Trace edema, neg homans.    H/H: Lab Results  Component Value Date/Time   HGB 10.9* 02/02/2014  6:48 AM   HCT 32.9* 02/02/2014  6:48 AM      Discharge Diagnoses: Term Pregnancy-delivered  Discharge Information: Date: 06/14/2011 Activity: pelvic rest Diet: routine  Medications: PNV, Ibuprofen, Colace and Percocet Breast feeding:  Yes Condition: stable Instructions: refer to handout Discharge to: home    Future Appointments Provider Department Dept Phone   03/04/2014 1:40 PM [place], [facility] [phone]       Medication List    STOP taking these medications       cephALEXin 500 MG capsule  Commonly known as:  KEFLEX     nitrofurantoin 100 MG capsule  Commonly known as:  MACRODANTIN      TAKE these medications       acetaminophen 325 MG tablet  Commonly known as:  TYLENOL  Take 650 mg by mouth every 6 (six) hours as needed for mild pain.     docusate sodium 100 MG capsule  Commonly known as:  COLACE  Take 1 capsule (100 mg total) by mouth every 12 (twelve) hours.     ibuprofen 600 MG tablet  Commonly known as:  ADVIL,MOTRIN  Take 1 tablet (600 mg total) by mouth every 6 (six) hours.     oxyCODONE-acetaminophen 5-325 MG per tablet  Commonly known as:  PERCOCET/ROXICET  Take 1-2 tablets by mouth every 4 (four) hours as needed for severe  pain (moderate - severe pain).     prenatal multivitamin Tabs tablet  Take 1 tablet by mouth daily at 12 noon.           Follow-up Information   Follow up with [facility] In 4 weeks.   Specialty:  Obstetrics and Gynecology   Contact information:   [place] [address] [phone]      Fredrik Rigger 02/03/2014,9:06 AM

## 2014-02-03 NOTE — Lactation Note (Signed)
This note was copied from the chart of Gina Jacquenette ShoneShaquae Macnair. Lactation Consultation Note  Patient Name: Gina Bruce FAOZH'YToday's Date: 02/03/2014 Reason for consult: Initial assessment (per mom recenly fed at 0930 and is on single photo tx ) Discussed with mom the importance of considering feeding at the breast more often due to the jaundice level And baby having to be treated with single photo tx . Baby last fed at 930 a per mom. LC encouraged mom to call for feeding assessment with feeding cues. Also discussed with mom post pumping to enhance the milk coming in sooner.LC spoke with [facility] RN caring for mom and baby to set up a DEBP for post pumping.    Maternal Data Formula Feeding for Exclusion: No  Feeding Feeding Type: Bottle Fed - Formula Nipple Type: Slow - flow Length of feed: 10 min  LATCH Score/Interventions  ? Earlier latch , [facility]  Latch: Grasps breast easily, tongue down, lips flanged, rhythmical sucking.  Audible Swallowing: A few with stimulation Intervention(s): Hand expression Intervention(s): Skin to skin  Type of Nipple: Everted at rest and after stimulation  Comfort (Breast/Nipple): Soft / non-tender     Hold (Positioning): No assistance needed to correctly position infant at breast. Intervention(s): Breastfeeding basics reviewed  LATCH Score: 9  Lactation Tools Discussed/Used [facility]   Consult Status Consult Status: Follow-up (encouraged mom to page with feeding cues.) Date: 02/03/14 Follow-up type: In-patient    Gina Bruce, Gina Bruce 02/03/2014, 11:13 AM

## 2014-02-05 ENCOUNTER — Ambulatory Visit: Payer: Self-pay

## 2014-02-05 NOTE — Lactation Note (Signed)
This note was copied from the chart of Boy Jacquenette ShoneShaquae Stemmler. Lactation Consultation Note  Patient Name: Boy Jacquenette ShoneShaquae Saez ZOXWR'UToday's Date: 02/05/2014 Reason for consult: Follow-up assessment;Hyperbilirubinemia with baby still receiving phototherapy.  Mom is continuing to both breast and formula/bottle feed and is obtaining up to 30 ml's of ebm which she has fed to baby by bottle, as well.  Baby received 60 ml's of formula 2 hours ago and is asleep under phototx at this time.  LC discussed plans and recommendations with both mom and her nurse Claudina Lick(Mary Fitch, RN).  Mom has filling breasts but is not engorged.  She pumped 30 ml's and breastfed 15 minutes and previous feeding this afternoon.  LC reviewed importance of frequent milk flow from both breasts, with encouragement to nurse on cue but to breastfeed and/or pump at least every 3 hours.  LC stressed the benefits of ebm for increasing baby's stools and lowering bilirubin level.  LC also reviewed milk storage guidelines (Baby and Me, page 25) and engorgement care (Baby and Me, page 9).   Maternal Data    Feeding Feeding Type: Bottle Fed - Formula  LATCH Score/Interventions           most recent LATCH score=8 per RN, Corrie DandyMary at [id number]           Lactation Tools Discussed/Used Pump Review: Setup, frequency, and cleaning;Milk Storage (encouraged mom to nurse and/or pump frequently (at least every 3 hours))   Consult Status Consult Status: Follow-up Date: 02/06/14 Follow-up type: In-patient    Warrick ParisianBryant, Asli Tokarski [facility] 02/05/2014, 8:11 PM

## 2014-03-04 ENCOUNTER — Ambulatory Visit (INDEPENDENT_AMBULATORY_CARE_PROVIDER_SITE_OTHER): Payer: Medicaid Other | Admitting: Obstetrics and Gynecology

## 2014-03-04 ENCOUNTER — Encounter (HOSPITAL_COMMUNITY): Payer: Self-pay | Admitting: *Deleted

## 2014-03-04 ENCOUNTER — Inpatient Hospital Stay (HOSPITAL_COMMUNITY): Payer: Medicaid Other

## 2014-03-04 ENCOUNTER — Inpatient Hospital Stay (HOSPITAL_COMMUNITY)
Admission: AD | Admit: 2014-03-04 | Discharge: 2014-03-04 | Disposition: A | Discharge status: Home / Self Care | Payer: Medicaid Other | Source: Ambulatory Visit | Attending: Obstetrics & Gynecology | Admitting: Obstetrics & Gynecology

## 2014-03-04 ENCOUNTER — Ambulatory Visit: Payer: Medicaid Other | Admitting: Obstetrics and Gynecology

## 2014-03-04 ENCOUNTER — Encounter: Payer: Self-pay | Admitting: Obstetrics and Gynecology

## 2014-03-04 DIAGNOSIS — Z30430 Encounter for insertion of intrauterine contraceptive device: Secondary | ICD-10-CM

## 2014-03-04 DIAGNOSIS — R102 Pelvic and perineal pain: Secondary | ICD-10-CM

## 2014-03-04 DIAGNOSIS — N949 Unspecified condition associated with female genital organs and menstrual cycle: Secondary | ICD-10-CM

## 2014-03-04 DIAGNOSIS — O34219 Maternal care for unspecified type scar from previous cesarean delivery: Secondary | ICD-10-CM

## 2014-03-04 DIAGNOSIS — R109 Unspecified abdominal pain: Secondary | ICD-10-CM

## 2014-03-04 DIAGNOSIS — K8020 Calculus of gallbladder without cholecystitis without obstruction: Secondary | ICD-10-CM

## 2014-03-04 DIAGNOSIS — Z30431 Encounter for routine checking of intrauterine contraceptive device: Secondary | ICD-10-CM

## 2014-03-04 LAB — POCT PREGNANCY, URINE: PREG TEST UR: NEGATIVE

## 2014-03-04 MED ORDER — LEVONORGESTREL 20 MCG/24HR IU IUD
INTRAUTERINE_SYSTEM | Freq: Once | INTRAUTERINE | Status: AC
  Administered 2014-03-04: 15:00:00 via INTRAUTERINE

## 2014-03-04 MED ORDER — KETOROLAC TROMETHAMINE 60 MG/2ML IM SOLN
60.0000 mg | Freq: Once | INTRAMUSCULAR | Status: AC
  Administered 2014-03-04: 60 mg via INTRAMUSCULAR
  Filled 2014-03-04: qty 2

## 2014-03-04 MED ORDER — HYDROCODONE-IBUPROFEN 5-200 MG PO TABS: 1.0000 | ORAL_TABLET | ORAL | Status: DC | PRN

## 2014-03-04 NOTE — MAU Provider Note (Signed)
Chief Complaint: Abdominal Pain   None    SUBJECTIVE HPI: Gina Bruce is a 23 y.o. [phone] who presents to maternity admissions reporting severe abdominal cramping since her IUD was placed in clinic today.  The pt reports she started having pain about 20 minutes after the Mirena was placed.  She took 600 mg Ibuprofen as prescribed but the pain has persisted.  She reports she has gallstones and this pain is worse.  She reports scant vaginal bleeding and denies vaginal itching/burning, urinary symptoms, h/a, dizziness, n/v, or fever/chills.     Past Medical History  Diagnosis Date  . Ectopic pregnancy     Given MTX  . Gallstones    Past Surgical History  Procedure Laterality Date  . Induced abortion    . Cesarean section    . Dilation and curettage of uterus    . Cesarean section N/A 02/01/2014    Procedure: REPEAT CESAREAN SECTION X 1;  Surgeon: Willodean Rosenthalarolyn Harraway-Smith, MD;  Location: [facility];  Service: Obstetrics;  Laterality: N/A;   History   Social History  . Marital Status: Single    Spouse Name: N/A    Number of Children: N/A  . Years of Education: N/A   Occupational History  . Not on file.   Social History Main Topics  . Smoking status: Never Smoker   . Smokeless tobacco: Never Used  . Alcohol Use: No  . Drug Use: No  . Sexual Activity: Yes    Birth Control/ Protection: None   Other Topics Concern  . Not on file   Social History Narrative  . No narrative on file   No current facility-administered medications on file prior to encounter.   Current Outpatient Prescriptions on File Prior to Encounter  Medication Sig Dispense Refill  . Prenatal Vit-Fe Fumarate-FA (PRENATAL MULTIVITAMIN) TABS tablet Take 1 tablet by mouth daily at 12 noon.       No Known Allergies  ROS: Pertinent items in HPI  OBJECTIVE Blood pressure 130/71, pulse 90, temperature 98.5 F (36.9 C), temperature source Oral, resp. rate 20, not currently breastfeeding. GENERAL: Well-developed,  well-nourished female in no acute distress.  HEENT: Normocephalic HEART: normal rate RESP: normal effort ABDOMEN: Soft, non-tender Pelvic: External: no lesion Vagina: small amount of white discharge Cervix: pink, smooth, no CMT. Cannot visualize string at this time.  Uterus: NSSC Adnexa: NT EXTREMITIES: Nontender, no edema NEURO: Alert and oriented   LAB RESULTS Results for orders placed in visit on 03/04/14 (from the past 24 hour(s))  POCT PREGNANCY, URINE     Status: None   Collection Time    03/04/14  2:29 PM      Result Value Ref Range   Preg Test, Ur NEGATIVE  NEGATIVE   [place] Transvaginal Non-ob  03/04/2014   CLINICAL DATA:  Pelvic pain.  IUD placed 6 hr ago.  EXAM: TRANSABDOMINAL AND TRANSVAGINAL ULTRASOUND OF PELVIS  TECHNIQUE: Both transabdominal and transvaginal ultrasound examinations of the pelvis were performed. Transabdominal technique was performed for global imaging of the pelvis including uterus, ovaries, adnexal regions, and pelvic cul-de-sac. It was necessary to proceed with endovaginal exam following the transabdominal exam to visualize the uterus and endometrium.  COMPARISON:  Prior CT abdomen/ pelvis 10/02/2011  FINDINGS: Uterus  Measurements: 11.0 x 4.9 x 6.4 cm. Linear echogenic structure noted in the lower uterine segment just above the cervix.  Endometrium  Thickness: 9.2 mm.  Small amount of fluid.  Right ovary  Measurements: 2.6 x 1.5 x 1.3  cm. Normal appearance/no adnexal mass.  Left ovary  Measurements: 5.1 x 2.1 x 1.9 cm. Normal appearance/no adnexal mass.  Other findings  No free fluid.  IMPRESSION: The IUD appears to be low lying in the lower uterine segment.  Negative for free fluid or hemo peritoneum.   Electronically Signed   By: Malachy Moan M.D.   On: 03/04/2014 22:04   US Pelvis Complete  03/04/2014   CLINICAL DATA:  Pelvic pain.  IUD placed 6 hr ago.  EXAM: TRANSABDOMINAL AND TRANSVAGINAL ULTRASOUND OF PELVIS  TECHNIQUE: Both transabdominal and  transvaginal ultrasound examinations of the pelvis were performed. Transabdominal technique was performed for global imaging of the pelvis including uterus, ovaries, adnexal regions, and pelvic cul-de-sac. It was necessary to proceed with endovaginal exam following the transabdominal exam to visualize the uterus and endometrium.  COMPARISON:  Prior CT abdomen/ pelvis 10/02/2011  FINDINGS: Uterus  Measurements: 11.0 x 4.9 x 6.4 cm. Linear echogenic structure noted in the lower uterine segment just above the cervix.  Endometrium  Thickness: 9.2 mm.  Small amount of fluid.  Right ovary  Measurements: 2.6 x 1.5 x 1.3 cm. Normal appearance/no adnexal mass.  Left ovary  Measurements: 5.1 x 2.1 x 1.9 cm. Normal appearance/no adnexal mass.  Other findings  No free fluid.  IMPRESSION: The IUD appears to be low lying in the lower uterine segment.  Negative for free fluid or hemo peritoneum.   Electronically Signed   By: Malachy Moan M.D.   On: 03/04/2014 22:04    Report to Gina Bruce, CNM 2238: D/W Dr. Erin Fulling, will treat with vicoprofen for 2-3 days. FU in the clinic   A/P:  1. Pelvic pain    RX: vicoprofen FU in the clinic if pain worsens    Holly Bodily Leftwich-Kirby Certified Nurse-Midwife 03/04/2014  7:39 PM

## 2014-03-04 NOTE — Discharge Instructions (Signed)
Intrauterine Device Insertion, Care After  Refer to this sheet in the next few weeks. These instructions provide you with information on caring for yourself after your procedure. Your health care provider may also give you more specific instructions. Your treatment has been planned according to current medical practices, but problems sometimes occur. Call your health care provider if you have any problems or questions after your procedure.  WHAT TO EXPECT AFTER THE PROCEDURE  Insertion of the IUD may cause some discomfort, such as cramping. The cramping should improve after the IUD is in place. You may have bleeding after the procedure. This is normal. It varies from light spotting for a few days to menstrual-like bleeding. When the IUD is in place, a string will extend past the cervix into the vagina for 1 2 inches. The strings should not bother you or your partner. If they do, talk to your health care provider.   HOME CARE INSTRUCTIONS    Check your intrauterine device (IUD) to make sure it is in place before you resume sexual activity. You should be able to feel the strings. If you cannot feel the strings, something may be wrong. The IUD may have fallen out of the uterus, or the uterus may have been punctured (perforated) during placement. Also, if the strings are getting longer, it may mean that the IUD is being forced out of the uterus. You no longer have full protection from pregnancy if any of these problems occur.   You may resume sexual intercourse if you are not having problems with the IUD. The copper IUD is considered immediately effective, and the hormone IUD works right away if inserted within 7 days of your period starting. You will need to use a backup method of birth control for 7 days if the IUD in inserted at any other time in your cycle.   Continue to check that the IUD is still in place by feeling for the strings after every menstrual period.   You may need to take pain medicine such as  acetaminophen or ibuprofen. Only take medicines as directed by your health care provider.  SEEK MEDICAL CARE IF:    You have bleeding that is heavier or lasts longer than a normal menstrual cycle.   You have a fever.   You have increasing cramps or abdominal pain not relieved with medicine.   You have abdominal pain that does not seem to be related to the same area of earlier cramping and pain.   You are lightheaded, unusually weak, or faint.   You have abnormal vaginal discharge or smells.   You have pain during sexual intercourse.   You cannot feel the IUD strings, or the IUD string has gotten longer.   You feel the IUD at the opening of the cervix in the vagina.   You think you are pregnant, or you miss your menstrual period.   The IUD string is hurting your sex partner.  MAKE SURE YOU:   Understand these instructions.   Will watch your condition.   Will get help right away if you are not doing well or get worse.  Document Released: 07/18/2011 Document Revised: 09/09/2013 Document Reviewed: 05/10/2013  ExitCare Patient Information 2014 [employer].

## 2014-03-04 NOTE — Progress Notes (Signed)
  Subjective:     Gina Bruce is a 23 y.o. female G2p2002 who presents for a postpartum visit. She is 4 weeks postpartum following aN ELECTIVE REPEAT  low cervical transverse Cesarean section. I have fully reviewed the prenatal and intrapartum course. The delivery was at 39 gestational weeks. Outcome: primary cesarean section, low transverse incision. Anesthesia: spinal. Postpartum course has been uncomplicated. Baby's course has been uncomplicated. Baby is feeding by both breast and bottle - Enfamil AR. Bleeding no bleeding. Bowel function is normal. Bladder function is normal. Patient is not sexually active. Contraception method is abstinence. Postpartum depression screening: negative.  The following portions of the patient's history were reviewed and updated as appropriate: allergies, current medications, past family history, past medical history, past social history, past surgical history and problem list.  Review of Systems Pertinent items are noted in HPI.   Objective:    BP 124/74  Pulse 77  Temp(Src) 97.4 F (36.3 C) (Oral)  Ht 5\' 2"  (1.575 m)  Wt 160 lb 11.2 oz (72.893 kg)  BMI 29.38 kg/m2  Breastfeeding? No  General:  alert and no distress   Breasts:  inspection negative, no nipple discharge or bleeding, no masses or nodularity palpable  Lungs: clear to auscultation bilaterally  Heart:  regular rate and rhythm, S1, S2 normal, no murmur, click, rub or gallop  Abdomen: soft, non-tender; bowel sounds normal; no masses,  no organomegaly   Vulva:  normal  Vagina: normal vagina  Cervix:  no lesions  Corpus: anteverted  Adnexa:  no mass, fullness, tenderness  Rectal Exam: Not performed.        Assessment:     4wks  postpartum exam. Pap smear not done at today's visit.   Plan:    1. Contraception: IUD 2. Needs Pap next visit 3. Follow up in: 6 weeks or as needed.   Procedure Note  Explained risks of IUD insertion, including bleeding, infection, perforation of uterus,  expulsion of device. Accepts and understands. With pt in lithotomy, visualized posterior cx and cleasnsed with Betadine swab x2. Single toothed tenaculum placed at 10 and 2 o'clock. Mirena IUD inserted in usual fashion. Strings cut to 3 cm. No active bleeding with tenaculum removal.  Patient tolerated procedure well.   Danae Orleanseirdre C Poe, CNM 03/04/2014 3:03 PM

## 2014-03-04 NOTE — Patient Instructions (Signed)
Intrauterine Device Insertion   Most often, an intrauterine device (IUD) is inserted into the uterus to prevent pregnancy. There are 2 types of IUDs available:  · Copper IUD This type of IUD creates an environment that is not favorable to sperm survival. The mechanism of action of the copper IUD is not known for certain. It can stay in place for 10 years.  · Hormone IUD This type of IUD contains the hormone progestin (synthetic progesterone). The progestin thickens the cervical mucus and prevents sperm from entering the uterus, and it also thins the uterine lining. There is no evidence that the hormone IUD prevents implantation. One hormone IUD can stay in place for up to 5 years, and a different hormone IUD can stay in place for up to 3 years.  An IUD is the most cost-effective birth control if left in place for the full duration. It may be removed at any time.  LET YOUR HEALTH CARE PROVIDER KNOW ABOUT:  · Any allergies you have.  · All medicines you are taking, including vitamins, herbs, eye drops, creams, and over-the-counter medicines.  · Previous problems you or members of your family have had with the use of anesthetics.  · Any blood disorders you have.  · Previous surgeries you have had.  · Possibility of pregnancy.  · Medical conditions you have.  RISKS AND COMPLICATIONS   Generally, intrauterine device insertion is a safe procedure. However, as with any procedure, complications can occur. Possible complications include:  · Accidental puncture (perforation) of the uterus.  · Accidental placement of the IUD either in the muscle layer of the uterus (myometrium) or outside the uterus. If this happens, the IUD can be found essentially floating around the bowels and must be taken out surgically.  · The IUD may fall out of the uterus (expulsion). This is more common in women who have recently had a child.    · Pregnancy in the fallopian tube (ectopic).  · Pelvic inflammatory disease (PID), which is infection of  the uterus and fallopian tubes. The risk of PID is slightly increased in the first 20 days after the IUD is placed, but the overall risk is still very low.  BEFORE THE PROCEDURE  · Schedule the IUD insertion for when you will have your menstrual period or right after, to make sure you are not pregnant. Placement of the IUD is better tolerated shortly after a menstrual cycle.  · You may need to take tests or be examined to make sure you are not pregnant.  · You may be required to take a pregnancy test.  · You may be required to get checked for sexually transmitted infections (STIs) prior to placement. Placing an IUD in someone who has an infection can make the infection worse.  · You may be given a pain reliever to take 1 or 2 hours before the procedure.  · An exam will be performed to determine the size and position of your uterus.  · Ask your health care provider about changing or stopping your regular medicines.  PROCEDURE   · A tool (speculum) is placed in the vagina. This allows your health care provider to see the lower part of the uterus (cervix).  · The cervix is prepped with a medicine that lowers the risk of infection.  · You may be given a medicine to numb each side of the cervix (intracervical or paracervical block). This is used to block and control any discomfort with insertion.  · A tool (  uterine sound) is inserted into the uterus to determine the length of the uterine cavity and the direction the uterus may be tilted.  · A slim instrument (IUD inserter) is inserted through the cervical canal and into your uterus.  · The IUD is placed in the uterine cavity and the insertion device is removed.  · The nylon string that is attached to the IUD and used for eventual IUD removal is trimmed. It is trimmed so that it lays high in the vagina, just outside the cervix.  AFTER THE PROCEDURE  · You may have bleeding after the procedure. This is normal. It varies from light spotting for a few days to menstrual-like  bleeding.   · You may have mild cramping.  Document Released: 07/18/2011 Document Revised: 09/09/2013 Document Reviewed: 05/10/2013  Exit[employer]® Patient Information ©2014 [employer].

## 2014-03-04 NOTE — MAU Note (Addendum)
IUD placed at 1430 today, having a lot of pain, cramping

## 2014-03-05 NOTE — MAU Provider Note (Signed)
Attestation of Attending Supervision of Advanced Practitioner (CNM/NP): Evaluation and management procedures were performed by the Advanced Practitioner under my supervision and collaboration.  I have reviewed the Advanced Practitioner's note and chart, and I agree with the management and plan.  HARRAWAY-SMITH, Mariabelen Pressly 12:06 AM     

## 2014-03-28 ENCOUNTER — Encounter (HOSPITAL_COMMUNITY): Payer: Self-pay

## 2014-03-28 ENCOUNTER — Inpatient Hospital Stay (HOSPITAL_COMMUNITY)
Admission: AD | Admit: 2014-03-28 | Discharge: 2014-03-28 | Disposition: A | Discharge status: Home / Self Care | Payer: Medicaid Other | Source: Ambulatory Visit | Attending: Obstetrics & Gynecology | Admitting: Obstetrics & Gynecology

## 2014-03-28 DIAGNOSIS — N949 Unspecified condition associated with female genital organs and menstrual cycle: Secondary | ICD-10-CM | POA: Insufficient documentation

## 2014-03-28 DIAGNOSIS — T8339XA Other mechanical complication of intrauterine contraceptive device, initial encounter: Secondary | ICD-10-CM | POA: Insufficient documentation

## 2014-03-28 DIAGNOSIS — Y849 Medical procedure, unspecified as the cause of abnormal reaction of the patient, or of later complication, without mention of misadventure at the time of the procedure: Secondary | ICD-10-CM | POA: Insufficient documentation

## 2014-03-28 DIAGNOSIS — K8020 Calculus of gallbladder without cholecystitis without obstruction: Secondary | ICD-10-CM | POA: Insufficient documentation

## 2014-03-28 DIAGNOSIS — R102 Pelvic and perineal pain: Secondary | ICD-10-CM

## 2014-03-28 LAB — URINALYSIS, ROUTINE W REFLEX MICROSCOPIC
Bilirubin Urine: NEGATIVE
GLUCOSE, UA: NEGATIVE mg/dL
Ketones, ur: NEGATIVE mg/dL
LEUKOCYTES UA: NEGATIVE
NITRITE: NEGATIVE
PROTEIN: NEGATIVE mg/dL
Specific Gravity, Urine: 1.01 (ref 1.005–1.030)
UROBILINOGEN UA: 1 mg/dL (ref 0.0–1.0)
pH: 6.5 (ref 5.0–8.0)

## 2014-03-28 LAB — URINE MICROSCOPIC-ADD ON

## 2014-03-28 LAB — POCT PREGNANCY, URINE: Preg Test, Ur: NEGATIVE

## 2014-03-28 MED ORDER — OXYCODONE-ACETAMINOPHEN 5-325 MG PO TABS
1.0000 | ORAL_TABLET | Freq: Once | ORAL | Status: AC
  Administered 2014-03-28: 1 via ORAL
  Filled 2014-03-28: qty 1

## 2014-03-28 MED ORDER — OXYCODONE-ACETAMINOPHEN 5-325 MG PO TABS: 2.0000 | ORAL_TABLET | ORAL | Status: DC | PRN

## 2014-03-28 MED ORDER — PROMETHAZINE HCL 25 MG PO TABS
25.0000 mg | ORAL_TABLET | Freq: Once | ORAL | Status: AC
  Administered 2014-03-28: 25 mg via ORAL
  Filled 2014-03-28: qty 1

## 2014-03-28 NOTE — Discharge Instructions (Signed)
Pelvic Pain, Female °Female pelvic pain can be caused by many different things and start from a variety of places. Pelvic pain refers to pain that is located in the lower half of the abdomen and between your hips. The pain may occur over a short period of time (acute) or may be reoccurring (chronic). The cause of pelvic pain may be related to disorders affecting the female reproductive organs (gynecologic), but it may also be related to the bladder, kidney stones, an intestinal complication, or muscle or skeletal problems. Getting help right away for pelvic pain is important, especially if there has been severe, sharp, or a sudden onset of unusual pain. It is also important to get help right away because some types of pelvic pain can be life threatening.  °CAUSES  °Below are only some of the causes of pelvic pain. The causes of pelvic pain can be in one of several categories.  °· Gynecologic. °· Pelvic inflammatory disease. °· Sexually transmitted infection. °· Ovarian cyst or a twisted ovarian ligament (ovarian torsion). °· Uterine lining that grows outside the uterus (endometriosis). °· Fibroids, cysts, or tumors. °· Ovulation. °· Pregnancy. °· Pregnancy that occurs outside the uterus (ectopic pregnancy). °· Miscarriage. °· Labor. °· Abruption of the placenta or ruptured uterus. °· Infection. °· Uterine infection (endometritis). °· Bladder infection. °· Diverticulitis. °· Miscarriage related to a uterine infection (septic abortion). °· Bladder. °· Inflammation of the bladder (cystitis). °· Kidney stone(s). °· Gastrointenstinal. °· Constipation. °· Diverticulitis. °· Neurologic. °· Trauma. °· Feeling pelvic pain because of mental or emotional causes (psychosomatic). °· Cancers of the bowel or pelvis. °EVALUATION  °Your caregiver will want to take a careful history of your concerns. This includes recent changes in your health, a careful gynecologic history of your periods (menses), and a sexual history. Obtaining  your family history and medical history is also important. Your caregiver may suggest a pelvic exam. A pelvic exam will help identify the location and severity of the pain. It also helps in the evaluation of which organ system may be involved. In order to identify the cause of the pelvic pain and be properly treated, your caregiver may order tests. These tests may include:  °· A pregnancy test. °· Pelvic ultrasonography. °· An X-ray exam of the abdomen. °· A urinalysis or evaluation of vaginal discharge. °· Blood tests. °HOME CARE INSTRUCTIONS  °· Only take over-the-counter or prescription medicines for pain, discomfort, or fever as directed by your caregiver.   °· Rest as directed by your caregiver.   °· Eat a balanced diet.   °· Drink enough fluids to make your urine clear or pale yellow, or as directed.   °· Avoid sexual intercourse if it causes pain.   °· Apply warm or cold compresses to the lower abdomen depending on which one helps the pain.   °· Avoid stressful situations.   °· Keep a journal of your pelvic pain. Write down when it started, where the pain is located, and if there are things that seem to be associated with the pain, such as food or your menstrual cycle. °· Follow up with your caregiver as directed.   °SEEK MEDICAL CARE IF: °· Your medicine does not help your pain. °· You have abnormal vaginal discharge. °SEEK IMMEDIATE MEDICAL CARE IF:  °· You have heavy bleeding from the vagina.   °· Your pelvic pain increases.   °· You feel lightheaded or faint.   °· You have chills.   °· You have pain with urination or blood in your urine.   °· You have uncontrolled   diarrhea or vomiting.   °· You have a fever or persistent symptoms for more than 3 days. °· You have a fever and your symptoms suddenly get worse.   °· You are being physically or sexually abused.   °MAKE SURE YOU: °· Understand these instructions. °· Will watch your condition. °· Will get help if you are not doing well or get worse. °Document  Released: 10/16/2004 Document Revised: 05/20/2012 Document Reviewed: 03/10/2012 °ExitCare® Patient Information ©2014 [employer]. ° °

## 2014-03-28 NOTE — MAU Note (Signed)
Pt presents complaining of discomfort from IUD that started right after she got it put in on 4/2. Complains of heavy vaginal bleeding that also started on 4/2 with headaches.

## 2014-03-28 NOTE — MAU Provider Note (Signed)
  History     CSN: [id number]  Arrival date and time: 03/28/14 1515   First Provider Initiated Contact with Patient 03/28/14 1543      Chief Complaint  Patient presents with  . Illegal value: [    Remove IUD   HPI Comments: Gina Bruce 23 y.o. [id number] presents to MAU with pain and bleeding with Mirena IUD. She has had problems with it from the day it was inserted. [facility] and [facility] around that day and was seen in MAU that evening for pain and bleeding. Since then [facility] and placed on BCP's. That was one week ago and she has not noticed any change in her pain and bleeding. Bleeding is one pad per hour and interfering with work and life.     Past Medical History  Diagnosis Date  . Ectopic pregnancy     Given MTX  . Gallstones     Past Surgical History  Procedure Laterality Date  . Induced abortion    . Cesarean section    . Dilation and curettage of uterus    . Cesarean section N/A 02/01/2014    Procedure: REPEAT CESAREAN SECTION X 1;  Surgeon: Willodean Rosenthalarolyn Harraway-Smith, MD;  Location: [facility];  Service: Obstetrics;  Laterality: N/A;    History reviewed. No pertinent family history.  History  Substance Use Topics  . Smoking status: Never Smoker   . Smokeless tobacco: Never Used  . Alcohol Use: No    Allergies: No Known Allergies  Prescriptions prior to admission  Medication Sig Dispense Refill  . ibuprofen (ADVIL,MOTRIN) 600 MG tablet Take 600 mg by mouth every 6 (six) hours as needed for moderate pain.      . Prenatal Vit-Fe Fumarate-FA (PRENATAL MULTIVITAMIN) TABS tablet Take 1 tablet by mouth daily at 12 noon.        Review of Systems  Constitutional: Negative.   HENT: Negative.   Eyes: Negative.   Respiratory: Negative.   Cardiovascular: Negative.   Gastrointestinal: Negative.   Genitourinary:       Vaginal bleeding and cramps  Musculoskeletal: Negative.   Skin: Negative.   Neurological: Negative.    Psychiatric/Behavioral: Negative.    Physical Exam   Blood pressure 128/70, pulse 95, temperature 98 F (36.7 C), temperature source Oral, resp. rate 18, not currently breastfeeding.  Physical Exam  Constitutional: She is oriented to person, place, and time. She appears well-developed and well-nourished. No distress.  HENT:  Head: Normocephalic and atraumatic.  Eyes: Pupils are equal, round, and reactive to light.  Genitourinary:  Genital: External negative Vaginal:moderate amount blood Cervix:unable to see IUD strings/ multiple attempts to remove IUD unsuccessful Bimanual: Tender in cervix and uterus   Musculoskeletal: Normal range of motion.  Neurological: She is alert and oriented to person, place, and time.  Skin: Skin is warm and dry.  Psychiatric: She has a normal mood and affect. Her behavior is normal. Judgment and thought content normal.    MAU Course  Procedures  MDM  Pain when trying to remove IUD strings will medicate with percocet/Phenergan and retry  Assessment and Plan   A: pelvic pain due to IUD misplacement  P: [facility] for 3 [facility] Percocet and Motrin prior to procedure  HCA IncLinda M Yechezkel Fertig 03/28/2014, 4:24 PM

## 2014-03-29 ENCOUNTER — Encounter: Payer: Self-pay | Admitting: *Deleted

## 2014-03-29 ENCOUNTER — Ambulatory Visit (INDEPENDENT_AMBULATORY_CARE_PROVIDER_SITE_OTHER): Payer: Medicaid Other | Admitting: Obstetrics & Gynecology

## 2014-03-29 ENCOUNTER — Encounter: Payer: Self-pay | Admitting: Obstetrics & Gynecology

## 2014-03-29 VITALS — BP 114/76 | HR 97 | Temp 98.3°F | Ht 62.0 in | Wt 162.1 lb

## 2014-03-29 DIAGNOSIS — T8389XA Other specified complication of genitourinary prosthetic devices, implants and grafts, initial encounter: Secondary | ICD-10-CM

## 2014-03-29 DIAGNOSIS — T839XXA Unspecified complication of genitourinary prosthetic device, implant and graft, initial encounter: Secondary | ICD-10-CM

## 2014-03-29 NOTE — Progress Notes (Signed)
Subjective:     Patient ID: Gina Bruce, female   DOB: 09-18-1991, 23 y.o.   MRN: [id number]  [id number] No LMP recorded. [facility], had heavy bleeding afterward and some clots. Requested IUD removal in MAU yesterday but strings were not visible.   Review of Systems  Constitutional: Negative.   Gastrointestinal: Negative.   Genitourinary: Positive for vaginal bleeding and menstrual problem.       Objective:   Physical Exam  Constitutional: She is oriented to person, place, and time. She appears well-developed and well-nourished. No distress.  Pulmonary/Chest: Effort normal.  Genitourinary: Uterus normal.  Vaginal bleeding , no strings, could not locate with dressing forceps  Neurological: She is alert and oriented to person, place, and time.  Skin: Skin is warm and dry.  Psychiatric: She has a normal mood and affect. Her behavior is normal.       Assessment:     Possible expelled IUD     Plan:     Pelvic US and RTC   Gina PhenixJames G Tura Roller, MD 03/29/2014 4:05 PM

## 2014-03-29 NOTE — Patient Instructions (Signed)
Levonorgestrel intrauterine device (IUD) What is this medicine? LEVONORGESTREL IUD (LEE voe nor jes trel) is a contraceptive (birth control) device. The device is placed inside the uterus by a healthcare professional. It is used to prevent pregnancy and can also be used to treat heavy bleeding that occurs during your period. Depending on the device, it can be used for 3 to 5 years. This medicine may be used for other purposes; ask your health care provider or pharmacist if you have questions. COMMON BRAND NAME(S): Mirena, Skyla What should I tell my health care provider before I take this medicine? They need to know if you have any of these conditions: -abnormal Pap smear -cancer of the breast, uterus, or cervix -diabetes -endometritis -genital or pelvic infection now or in the past -have more than one sexual partner or your partner has more than one partner -heart disease -history of an ectopic or tubal pregnancy -immune system problems -IUD in place -liver disease or tumor -problems with blood clots or take blood-thinners -use intravenous drugs -uterus of unusual shape -vaginal bleeding that has not been explained -an unusual or allergic reaction to levonorgestrel, other hormones, silicone, or polyethylene, medicines, foods, dyes, or preservatives -pregnant or trying to get pregnant -breast-feeding How should I use this medicine? This device is placed inside the uterus by a health care professional. Talk to your pediatrician regarding the use of this medicine in children. Special care may be needed. Overdosage: If you think you have taken too much of this medicine contact a poison control center or emergency room at once. NOTE: This medicine is only for you. Do not share this medicine with others. What if I miss a dose? This does not apply. What may interact with this medicine? Do not take this medicine with any of the following  medications: -amprenavir -bosentan -fosamprenavir This medicine may also interact with the following medications: -aprepitant -barbiturate medicines for inducing sleep or treating seizures -bexarotene -griseofulvin -medicines to treat seizures like carbamazepine, ethotoin, felbamate, oxcarbazepine, phenytoin, topiramate -modafinil -pioglitazone -rifabutin -rifampin -rifapentine -some medicines to treat HIV infection like atazanavir, indinavir, lopinavir, nelfinavir, tipranavir, ritonavir -St. John's wort -warfarin This list may not describe all possible interactions. Give your health care provider a list of all the medicines, herbs, non-prescription drugs, or dietary supplements you use. Also tell them if you smoke, drink alcohol, or use illegal drugs. Some items may interact with your medicine. What should I watch for while using this medicine? Visit your doctor or health care professional for regular check ups. See your doctor if you or your partner has sexual contact with others, becomes HIV positive, or gets a sexual transmitted disease. This product does not protect you against HIV infection (AIDS) or other sexually transmitted diseases. You can check the placement of the IUD yourself by reaching up to the top of your vagina with clean fingers to feel the threads. Do not pull on the threads. It is a good habit to check placement after each menstrual period. Call your doctor right away if you feel more of the IUD than just the threads or if you cannot feel the threads at all. The IUD may come out by itself. You may become pregnant if the device comes out. If you notice that the IUD has come out use a backup birth control method like condoms and call your health care provider. Using tampons will not change the position of the IUD and are okay to use during your period. What side effects may I   notice from receiving this medicine? Side effects that you should report to your doctor or  health care professional as soon as possible: -allergic reactions like skin rash, itching or hives, swelling of the face, lips, or tongue -fever, flu-like symptoms -genital sores -high blood pressure -no menstrual period for 6 weeks during use -pain, swelling, warmth in the leg -pelvic pain or tenderness -severe or sudden headache -signs of pregnancy -stomach cramping -sudden shortness of breath -trouble with balance, talking, or walking -unusual vaginal bleeding, discharge -yellowing of the eyes or skin Side effects that usually do not require medical attention (report to your doctor or health care professional if they continue or are bothersome): -acne -breast pain -change in sex drive or performance -changes in weight -cramping, dizziness, or faintness while the device is being inserted -headache -irregular menstrual bleeding within first 3 to 6 months of use -nausea This list may not describe all possible side effects. Call your doctor for medical advice about side effects. You may report side effects to FDA at 1-800-FDA-1088. Where should I keep my medicine? This does not apply. NOTE: This sheet is a summary. It may not cover all possible information. If you have questions about this medicine, talk to your doctor, pharmacist, or health care provider.  2014, Elsevier/Gold Standard. (2011-12-20 13:54:04)  

## 2014-04-02 ENCOUNTER — Ambulatory Visit (HOSPITAL_COMMUNITY): Admission: RE | Admit: 2014-04-02 | Payer: Medicaid Other | Source: Ambulatory Visit

## 2014-04-02 ENCOUNTER — Ambulatory Visit (HOSPITAL_COMMUNITY): Payer: Medicaid Other

## 2014-04-08 ENCOUNTER — Ambulatory Visit: Payer: Medicaid Other | Admitting: Obstetrics & Gynecology

## 2014-04-08 ENCOUNTER — Telehealth: Payer: Self-pay

## 2014-04-08 NOTE — Telephone Encounter (Signed)
Pt. Missed today's appointment. She was supposed to follow up after having pelvic US to have IUD removed. Pt. No showed ultrasound last week. Attempted to call patient. No answer. Left message stating if you would like to reschedule call clinic, however, it will be important for you to have ultrasound prior to appointment, call [phone] to reschedule ultrasound and then reschedule appointment for clinic if you wish.

## 2014-04-28 ENCOUNTER — Inpatient Hospital Stay (HOSPITAL_COMMUNITY): Payer: Medicaid Other

## 2014-04-28 ENCOUNTER — Inpatient Hospital Stay (HOSPITAL_COMMUNITY)
Admission: AD | Admit: 2014-04-28 | Discharge: 2014-04-28 | Disposition: A | Discharge status: Home / Self Care | Payer: Medicaid Other | Source: Ambulatory Visit | Attending: Family Medicine | Admitting: Family Medicine

## 2014-04-28 ENCOUNTER — Encounter (HOSPITAL_COMMUNITY): Payer: Self-pay | Admitting: *Deleted

## 2014-04-28 DIAGNOSIS — N926 Irregular menstruation, unspecified: Secondary | ICD-10-CM

## 2014-04-28 DIAGNOSIS — T8339XA Other mechanical complication of intrauterine contraceptive device, initial encounter: Secondary | ICD-10-CM | POA: Insufficient documentation

## 2014-04-28 DIAGNOSIS — N938 Other specified abnormal uterine and vaginal bleeding: Secondary | ICD-10-CM | POA: Insufficient documentation

## 2014-04-28 DIAGNOSIS — N949 Unspecified condition associated with female genital organs and menstrual cycle: Secondary | ICD-10-CM | POA: Insufficient documentation

## 2014-04-28 DIAGNOSIS — N390 Urinary tract infection, site not specified: Secondary | ICD-10-CM

## 2014-04-28 DIAGNOSIS — T148XXA Other injury of unspecified body region, initial encounter: Secondary | ICD-10-CM

## 2014-04-28 DIAGNOSIS — R1032 Left lower quadrant pain: Secondary | ICD-10-CM | POA: Insufficient documentation

## 2014-04-28 DIAGNOSIS — N939 Abnormal uterine and vaginal bleeding, unspecified: Secondary | ICD-10-CM

## 2014-04-28 LAB — WET PREP, GENITAL
CLUE CELLS WET PREP: NONE SEEN
Trich, Wet Prep: NONE SEEN
Yeast Wet Prep HPF POC: NONE SEEN

## 2014-04-28 LAB — URINALYSIS, ROUTINE W REFLEX MICROSCOPIC
Bilirubin Urine: NEGATIVE
GLUCOSE, UA: NEGATIVE mg/dL
Ketones, ur: NEGATIVE mg/dL
LEUKOCYTES UA: NEGATIVE
Nitrite: POSITIVE — AB
PROTEIN: NEGATIVE mg/dL
SPECIFIC GRAVITY, URINE: 1.015 (ref 1.005–1.030)
Urobilinogen, UA: 1 mg/dL (ref 0.0–1.0)
pH: 6 (ref 5.0–8.0)

## 2014-04-28 LAB — URINE MICROSCOPIC-ADD ON

## 2014-04-28 LAB — POCT PREGNANCY, URINE: PREG TEST UR: NEGATIVE

## 2014-04-28 MED ORDER — CIPROFLOXACIN HCL 250 MG PO TABS: 250.0000 mg | ORAL_TABLET | Freq: Two times a day (BID) | ORAL | Status: DC

## 2014-04-28 MED ORDER — IBUPROFEN 800 MG PO TABS: 800.0000 mg | ORAL_TABLET | Freq: Three times a day (TID) | ORAL | Status: DC

## 2014-04-28 MED ORDER — KETOROLAC TROMETHAMINE 60 MG/2ML IM SOLN
60.0000 mg | Freq: Once | INTRAMUSCULAR | Status: AC
  Administered 2014-04-28: 60 mg via INTRAMUSCULAR
  Filled 2014-04-28: qty 2

## 2014-04-28 MED ORDER — OXYCODONE-ACETAMINOPHEN 5-325 MG PO TABS: 1.0000 | ORAL_TABLET | Freq: Four times a day (QID) | ORAL | Status: DC | PRN

## 2014-04-28 MED ORDER — NORGESTIMATE-ETH ESTRADIOL 0.25-35 MG-MCG PO TABS: 1.0000 | ORAL_TABLET | Freq: Every day | ORAL | Status: DC

## 2014-04-28 NOTE — MAU Note (Signed)
Patient states she had a Mirena IUD placed on 4-2 and has had vaginal bleeding every day since that time. States some days heavy and having abdominal pain. States she has been seen in MAU and did not see the string. States she has had an ectopic pregnancy and this pain feels like that.

## 2014-04-28 NOTE — MAU Provider Note (Signed)
History     CSN: [id number]  Arrival date and time: 04/28/14 1213   First Provider Initiated Contact with Patient 04/28/14 1447      Chief Complaint  Patient presents with  . Vaginal Bleeding  . Abdominal Pain   HPI [name] is a 23 y.o. [phone] who presents to MAU today with complaint of vaginal bleeding and LLQ pain x 3 days. She states that she had a Mirena placed on 03/04/14 and has had some bleeding off and on since then. This morning it became much heavier. She has used 3 pads already today. She was seen in MAU earlier this month and the provider was unable to visualize the IUD strings. US showed that IUD was in the lower uterine segment. The patient was sent to [facility] for removal, which was attempted, but unsuccessful. Patient was scheduled for another [place] to confirm placement after that which she missed. She denies having seen the IUD fall out. She rates her pain at 8/10 now. She has not taken anything for pain. She denies fever, back pain, hematuria, UTI symptoms, N/V/D or constipation. She states last BM was yesterday.   OB History   Grav Para Term Preterm Abortions TAB SAB Ect Mult Living   4 2 2  2 1  1  2       Past Medical History  Diagnosis Date  . Ectopic pregnancy     Given MTX  . Gallstones     Past Surgical History  Procedure Laterality Date  . Induced abortion    . Cesarean section    . Dilation and curettage of uterus    . Cesarean section N/A 02/01/2014    Procedure: REPEAT CESAREAN SECTION X 1;  Surgeon: Willodean Rosenthal, MD;  Location: [facility];  Service: Obstetrics;  Laterality: N/A;    History reviewed. No pertinent family history.  History  Substance Use Topics  . Smoking status: Never Smoker   . Smokeless tobacco: Never Used  . Alcohol Use: No    Allergies: No Known Allergies  Prescriptions prior to admission  Medication Sig Dispense Refill  . Prenatal Vit-Fe Fumarate-FA (PRENATAL MULTIVITAMIN) TABS tablet Take 1 tablet by mouth  daily at 12 noon.        Review of Systems  Constitutional: Positive for malaise/fatigue. Negative for fever.  Gastrointestinal: Positive for abdominal pain. Negative for nausea, vomiting, diarrhea and constipation.  Genitourinary: Negative for dysuria, urgency and frequency.       + vaginal bleeding  Neurological: Negative for dizziness, loss of consciousness and weakness.   Physical Exam   Blood pressure 104/78, pulse 85, temperature 98.3 F (36.8 C), temperature source Oral, resp. rate 16, height 5\' 1"  (1.549 m), weight 164 lb (74.39 kg), SpO2 100.00%, not currently breastfeeding.  Physical Exam  Constitutional: She is oriented to person, place, and time. She appears well-developed and well-nourished. No distress.  HENT:  Head: Normocephalic and atraumatic.  Cardiovascular: Normal rate.   Respiratory: Effort normal.  GI: Soft. Bowel sounds are normal. She exhibits no distension and no mass. There is tenderness (mild tenderness to palpation of the lower abdomen more prominent in the LLQ). There is no rebound, no guarding and no CVA tenderness.  Genitourinary: Uterus is not enlarged and not tender. Cervix exhibits no motion tenderness, no discharge and no friability. Right adnexum displays no mass and no tenderness. Left adnexum displays no mass and no tenderness. There is bleeding (small amount of blood in the vaginal vault) around the  vagina. Vaginal discharge (scant mucus discharge noted) found.  IUD strings are not visualized at the cervical os  Neurological: She is alert and oriented to person, place, and time.  Skin: Skin is warm and dry. No erythema.  Psychiatric: She has a normal mood and affect.   Results for orders placed during the hospital encounter of 04/28/14 (from the past 24 hour(s))  URINALYSIS, ROUTINE W REFLEX MICROSCOPIC     Status: Abnormal   Collection Time    04/28/14  1:10 PM      Result Value Ref Range   Color, Urine YELLOW  YELLOW   APPearance HAZY (*)  CLEAR   Specific Gravity, Urine 1.015  1.005 - 1.030   pH 6.0  5.0 - 8.0   Glucose, UA NEGATIVE  NEGATIVE mg/dL   Hgb urine dipstick LARGE (*) NEGATIVE   Bilirubin Urine NEGATIVE  NEGATIVE   Ketones, ur NEGATIVE  NEGATIVE mg/dL   Protein, ur NEGATIVE  NEGATIVE mg/dL   Urobilinogen, UA 1.0  0.0 - 1.0 mg/dL   Nitrite POSITIVE (*) NEGATIVE   Leukocytes, UA NEGATIVE  NEGATIVE  URINE MICROSCOPIC-ADD ON     Status: Abnormal   Collection Time    04/28/14  1:10 PM      Result Value Ref Range   Squamous Epithelial / LPF FEW (*) RARE   WBC, UA 0-2  <3 WBC/hpf   RBC / HPF TOO NUMEROUS TO COUNT  <3 RBC/hpf   Bacteria, UA MANY (*) RARE  POCT PREGNANCY, URINE     Status: None   Collection Time    04/28/14  1:23 PM      Result Value Ref Range   Preg Test, Ur NEGATIVE  NEGATIVE  WET PREP, GENITAL     Status: Abnormal   Collection Time    04/28/14  3:05 PM      Result Value Ref Range   Yeast Wet Prep HPF POC NONE SEEN  NONE SEEN   Trich, Wet Prep NONE SEEN  NONE SEEN   Clue Cells Wet Prep HPF POC NONE SEEN  NONE SEEN   WBC, Wet Prep HPF POC FEW (*) NONE SEEN   [place] Transvaginal Non-ob  04/28/2014   CLINICAL DATA:  IUD placement, bleeding  EXAM: TRANSVAGINAL ULTRASOUND OF PELVIS  TECHNIQUE: Transvaginal ultrasound examination of the pelvis was performed including evaluation of the uterus, ovaries, adnexal regions, and pelvic cul-de-sac.  COMPARISON:  None.  FINDINGS: Uterus  Measurements: 8.4 x 4 x 5 cm. No fibroids or other mass visualized.  Endometrium  Thickness: 2.4 mm. The IUD is not visualized. No focal abnormality visualized.  Right ovary  Measurements: 36 x 15 x 20 mm. Normal appearance/no adnexal mass.  Left ovary  Measurements: 39 x 17 x 18 mm. Normal appearance/no adnexal mass.  Other findings:  No free fluid  IMPRESSION: 1. Interval removal of IUD. No evidence of endometrial clot or other abnormality.   Electronically Signed   By: Oley Balmaniel  Hassell M.D.   On: 04/28/2014 16:53    MAU Course   Procedures None  MDM UPT - negative UA, wet prep, GC/Chlamydia today US today for IUD placement Tramadol IM given in MAU Patient states that she was told they would start her on OCPs at the time of her period, but bleeding has been irregular. Rx for OCPs given today  Assessment and Plan  A: IUD expelled UTI Abdominal wall strain  P: Discharge home Rx for Percocet, Cipro and Sprintec given to patient Patient  advised to call [facility] for a follow-up appointment Patient advised that if symptoms worsen she should go to [facility] or [facility] for further evaluation of non-GYN pain Patient may return to MAU as needed or if her condition were to change or worsen  Freddi Starr, PA-C  04/28/2014, 5:11 PM

## 2014-04-28 NOTE — Discharge Instructions (Signed)
Urinary Tract Infection A urinary tract infection (UTI) can occur any place along the urinary tract. The tract includes the kidneys, ureters, bladder, and urethra. A type of germ called bacteria often causes a UTI. UTIs are often helped with antibiotic medicine.  HOME CARE   If given, take antibiotics as told by your doctor. Finish them even if you start to feel better.  Drink enough fluids to keep your pee (urine) clear or pale yellow.  Avoid tea, drinks with caffeine, and bubbly (carbonated) drinks.  Pee often. Avoid holding your pee in for a long time.  Pee before and after having sex (intercourse).  Wipe from front to back after you poop (bowel movement) if you are a woman. Use each tissue only once. GET HELP RIGHT AWAY IF:   You have back pain.  You have lower belly (abdominal) pain.  You have chills.  You feel sick to your stomach (nauseous).  You throw up (vomit).  Your burning or discomfort with peeing does not go away.  You have a fever.  Your symptoms are not better in 3 days. MAKE SURE YOU:   Understand these instructions.  Will watch your condition.  Will get help right away if you are not doing well or get worse. Document Released: 05/07/2008 Document Revised: 08/13/2012 Document Reviewed: 06/19/2012 [facility] Patient Information 2014 [place], [place]. Oral Contraception Information Oral contraceptive pills (OCPs) are medicines taken to prevent pregnancy. OCPs work by preventing the ovaries from releasing eggs. The hormones in OCPs also cause the cervical mucus to thicken, preventing the sperm from entering the uterus. The hormones also cause the uterine lining to become thin, not allowing a fertilized egg to attach to the inside of the uterus. OCPs are highly effective when taken exactly as prescribed. However, OCPs do not prevent sexually transmitted diseases (STDs). Safe sex practices, such as using condoms along with the pill, can help prevent STDs.  Before  taking the pill, you may have a physical exam and Pap test. Your health care provider may order blood tests. The health care provider will make sure you are a good candidate for oral contraception. Discuss with your health care provider the possible side effects of the OCP you may be prescribed. When starting an OCP, it can take 2 to 3 months for the body to adjust to the changes in hormone levels in your body.  TYPES OF ORAL CONTRACEPTION  The combination pill This pill contains estrogen and progestin (synthetic progesterone) hormones. The combination pill comes in 21-day, 28-day, or 91-day packs. Some types of combination pills are meant to be taken continuously (365-day pills). With 21-day packs, you do not take pills for 7 days after the last pill. With 28-day packs, the pill is taken every day. The last 7 pills are without hormones. Certain types of pills have more than 21 hormone-containing pills. With 91-day packs, the first 84 pills contain both hormones, and the last 7 pills contain no hormones or contain estrogen only.  The minipill This pill contains the progesterone hormone only. The pill is taken every day continuously. It is very important to take the pill at the same time each day. The minipill comes in packs of 28 pills. All 28 pills contain the hormone.  ADVANTAGES OF ORAL CONTRACEPTIVE PILLS  Decreases premenstrual symptoms.   Treats menstrual period cramps.   Regulates the menstrual cycle.   Decreases a heavy menstrual flow.   May treatacne, depending on the type of pill.   Treats abnormal  uterine bleeding.   Treats polycystic ovarian syndrome.   Treats endometriosis.   Can be used as emergency contraception.  THINGS THAT CAN MAKE ORAL CONTRACEPTIVE PILLS LESS EFFECTIVE OCPs can be less effective if:   You forget to take the pill at the same time every day.   You have a stomach or intestinal disease that lessens the absorption of the pill.   You take  OCPs with other medicines that make OCPs less effective, such as antibiotics, certain HIV medicines, and some seizure medicines.   You take expired OCPs.   You forget to restart the pill on day 7, when using the packs of 21 pills.  RISKS ASSOCIATED WITH ORAL CONTRACEPTIVE PILLS  Oral contraceptive pills can sometimes cause side effects, such as:  Headache.  Nausea.  Breast tenderness.  Irregular bleeding or spotting. Combination pills are also associated with a small increased risk of:  Blood clots.  Heart attack.  Stroke. Document Released: 02/09/2003 Document Revised: 09/09/2013 Document Reviewed: 05/10/2013 [facility] Patient Information 2014 [place]

## 2014-04-29 NOTE — MAU Provider Note (Signed)
Attestation of Attending Supervision of Advanced Practitioner (PA/CNM/NP): Evaluation and management procedures were performed by the Advanced Practitioner under my supervision and collaboration.  I have reviewed the Advanced Practitioner's note and chart, and I agree with the management and plan.  Reva Bores, MD [facility] Attending 04/29/2014 8:23 AM

## 2014-04-30 LAB — GC/CHLAMYDIA PROBE AMP
CT Probe RNA: NEGATIVE
GC PROBE AMP APTIMA: NEGATIVE

## 2014-07-23 ENCOUNTER — Encounter: Payer: Self-pay | Admitting: General Practice

## 2014-10-04 ENCOUNTER — Encounter (HOSPITAL_COMMUNITY): Payer: Self-pay | Admitting: *Deleted

## 2015-03-22 ENCOUNTER — Encounter (HOSPITAL_COMMUNITY): Payer: Self-pay | Admitting: Family Medicine

## 2015-03-22 ENCOUNTER — Emergency Department (HOSPITAL_COMMUNITY): Payer: Medicaid Other

## 2015-03-22 ENCOUNTER — Emergency Department (HOSPITAL_COMMUNITY)
Admission: EM | Admit: 2015-03-22 | Discharge: 2015-03-22 | Disposition: A | Discharge status: Home / Self Care | Payer: Medicaid Other | Attending: Emergency Medicine | Admitting: Emergency Medicine

## 2015-03-22 DIAGNOSIS — Z8719 Personal history of other diseases of the digestive system: Secondary | ICD-10-CM | POA: Insufficient documentation

## 2015-03-22 DIAGNOSIS — Z79899 Other long term (current) drug therapy: Secondary | ICD-10-CM | POA: Diagnosis not present

## 2015-03-22 DIAGNOSIS — S80212A Abrasion, left knee, initial encounter: Secondary | ICD-10-CM | POA: Diagnosis not present

## 2015-03-22 DIAGNOSIS — S60511A Abrasion of right hand, initial encounter: Secondary | ICD-10-CM | POA: Insufficient documentation

## 2015-03-22 DIAGNOSIS — Y92410 Unspecified street and highway as the place of occurrence of the external cause: Secondary | ICD-10-CM | POA: Diagnosis not present

## 2015-03-22 DIAGNOSIS — S0181XA Laceration without foreign body of other part of head, initial encounter: Secondary | ICD-10-CM | POA: Diagnosis not present

## 2015-03-22 DIAGNOSIS — Y998 Other external cause status: Secondary | ICD-10-CM | POA: Insufficient documentation

## 2015-03-22 DIAGNOSIS — Y9389 Activity, other specified: Secondary | ICD-10-CM | POA: Insufficient documentation

## 2015-03-22 LAB — CBC WITH DIFFERENTIAL/PLATELET
BASOS ABS: 0 10*3/uL (ref 0.0–0.1)
Basophils Relative: 0 % (ref 0–1)
EOS ABS: 0.1 10*3/uL (ref 0.0–0.7)
Eosinophils Relative: 1 % (ref 0–5)
HCT: 40.9 % (ref 36.0–46.0)
Hemoglobin: 13.9 g/dL (ref 12.0–15.0)
LYMPHS ABS: 1.5 10*3/uL (ref 0.7–4.0)
Lymphocytes Relative: 13 % (ref 12–46)
MCH: 26.5 pg (ref 26.0–34.0)
MCHC: 34 g/dL (ref 30.0–36.0)
MCV: 77.9 fL — ABNORMAL LOW (ref 78.0–100.0)
Monocytes Absolute: 1.2 10*3/uL — ABNORMAL HIGH (ref 0.1–1.0)
Monocytes Relative: 10 % (ref 3–12)
NEUTROS PCT: 76 % (ref 43–77)
Neutro Abs: 9.1 10*3/uL — ABNORMAL HIGH (ref 1.7–7.7)
Platelets: 255 10*3/uL (ref 150–400)
RBC: 5.25 MIL/uL — AB (ref 3.87–5.11)
RDW: 14.2 % (ref 11.5–15.5)
WBC: 11.9 10*3/uL — ABNORMAL HIGH (ref 4.0–10.5)

## 2015-03-22 LAB — I-STAT CHEM 8, ED
BUN: 11 mg/dL (ref 6–23)
CALCIUM ION: 1.2 mmol/L (ref 1.12–1.23)
CHLORIDE: 104 mmol/L (ref 96–112)
Creatinine, Ser: 0.7 mg/dL (ref 0.50–1.10)
GLUCOSE: 98 mg/dL (ref 70–99)
HCT: 44 % (ref 36.0–46.0)
Hemoglobin: 15 g/dL (ref 12.0–15.0)
POTASSIUM: 4.2 mmol/L (ref 3.5–5.1)
Sodium: 140 mmol/L (ref 135–145)
TCO2: 22 mmol/L (ref 0–100)

## 2015-03-22 LAB — I-STAT BETA HCG BLOOD, ED (MC, WL, AP ONLY)

## 2015-03-22 MED ORDER — SODIUM CHLORIDE 0.9 % IV BOLUS (SEPSIS)
1000.0000 mL | Freq: Once | INTRAVENOUS | Status: AC
  Administered 2015-03-22: 1000 mL via INTRAVENOUS

## 2015-03-22 MED ORDER — MORPHINE SULFATE 4 MG/ML IJ SOLN
4.0000 mg | Freq: Once | INTRAMUSCULAR | Status: AC
  Administered 2015-03-22: 4 mg via INTRAVENOUS
  Filled 2015-03-22: qty 1

## 2015-03-22 MED ORDER — HYDROCODONE-ACETAMINOPHEN 5-325 MG PO TABS: 1.0000 | ORAL_TABLET | Freq: Four times a day (QID) | ORAL | Status: DC | PRN

## 2015-03-22 MED ORDER — CYCLOBENZAPRINE HCL 5 MG PO TABS: 5.0000 mg | ORAL_TABLET | Freq: Three times a day (TID) | ORAL | Status: DC | PRN

## 2015-03-22 MED ORDER — IBUPROFEN 800 MG PO TABS: 800.0000 mg | ORAL_TABLET | Freq: Three times a day (TID) | ORAL | Status: DC

## 2015-03-22 NOTE — Discharge Instructions (Signed)
Rest for 3 days.   Take motrin 800 mg every 6 hrs for 2 days then as needed.   Take flexeril for muscle spasms.   Take vicodin for severe pain.   You are expected to have headaches, neck pain, back pain.  Follow up with your doctor.   Return to ER if you have worse pain, vomiting, headaches.
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
## 2016-03-07 ENCOUNTER — Emergency Department (HOSPITAL_COMMUNITY): Payer: Medicaid Other

## 2016-03-07 ENCOUNTER — Emergency Department (HOSPITAL_COMMUNITY)
Admission: EM | Admit: 2016-03-07 | Discharge: 2016-03-07 | Disposition: A | Discharge status: Home / Self Care | Payer: Medicaid Other | Attending: Emergency Medicine | Admitting: Emergency Medicine

## 2016-03-07 ENCOUNTER — Encounter (HOSPITAL_COMMUNITY): Payer: Self-pay | Admitting: Emergency Medicine

## 2016-03-07 DIAGNOSIS — Z8719 Personal history of other diseases of the digestive system: Secondary | ICD-10-CM | POA: Insufficient documentation

## 2016-03-07 DIAGNOSIS — Y92009 Unspecified place in unspecified non-institutional (private) residence as the place of occurrence of the external cause: Secondary | ICD-10-CM | POA: Insufficient documentation

## 2016-03-07 DIAGNOSIS — Y9389 Activity, other specified: Secondary | ICD-10-CM | POA: Insufficient documentation

## 2016-03-07 DIAGNOSIS — S99912A Unspecified injury of left ankle, initial encounter: Secondary | ICD-10-CM | POA: Diagnosis present

## 2016-03-07 DIAGNOSIS — S93402A Sprain of unspecified ligament of left ankle, initial encounter: Secondary | ICD-10-CM | POA: Diagnosis not present

## 2016-03-07 DIAGNOSIS — Z79899 Other long term (current) drug therapy: Secondary | ICD-10-CM | POA: Diagnosis not present

## 2016-03-07 DIAGNOSIS — Y998 Other external cause status: Secondary | ICD-10-CM | POA: Diagnosis not present

## 2016-03-07 DIAGNOSIS — W108XXA Fall (on) (from) other stairs and steps, initial encounter: Secondary | ICD-10-CM | POA: Diagnosis not present

## 2016-03-07 DIAGNOSIS — Z791 Long term (current) use of non-steroidal anti-inflammatories (NSAID): Secondary | ICD-10-CM | POA: Diagnosis not present

## 2016-03-07 MED ORDER — IBUPROFEN 800 MG PO TABS: 800.0000 mg | ORAL_TABLET | Freq: Three times a day (TID) | ORAL | Status: DC | PRN

## 2016-03-07 MED ORDER — HYDROCODONE-ACETAMINOPHEN 5-325 MG PO TABS
1.0000 | ORAL_TABLET | Freq: Four times a day (QID) | ORAL | Status: AC | PRN
Start: 2016-03-07 — End: ?

## 2016-03-07 NOTE — Discharge Instructions (Signed)
Your x-rays did not show any significant abnormalities.  He did have a sprain of your ankle.  Ice and elevate.  Follow-up with the orthopedist provided

## 2016-03-07 NOTE — ED Notes (Signed)
Per [facility] pt from home had fall today down the stairs , co left foot injury swelling lateral left foot. Per [facility] pt was unable to ambulate due to pain. Ice pack applied by EMS. Present pulse per EMS. Alert and oriented x 4.

## 2016-03-07 NOTE — ED Provider Notes (Signed)
CSN: [id number]     Arrival date & time 03/07/16  9147 History   First MD Initiated Contact with Patient 03/07/16 [phone]     Chief Complaint  Patient presents with  . left foot injury      (Consider location/radiation/quality/duration/timing/severity/associated sxs/prior Treatment) HPI The patient presents with left ankle pain due to fall. She was leaving her home at 0815 this morning, tripped over the 3rd stair from the ground, twisted and fell on her ankle. Since then she has not tolerated weight bearing since due to extreme pain, swelling and ecchymoses started to form and patient presented to the ED. Patient rates pain 9/10. Denies lacerations, falls on additional body parts, no head injury.  Past Medical History  Diagnosis Date  . Ectopic pregnancy     Given MTX  . Gallstones    Past Surgical History  Procedure Laterality Date  . Induced abortion    . Cesarean section    . Dilation and curettage of uterus    . Cesarean section N/A 02/01/2014    Procedure: REPEAT CESAREAN SECTION X 1;  Surgeon: Willodean Rosenthal, MD;  Location: [facility];  Service: Obstetrics;  Laterality: N/A;   No family history on file. Social History  Substance Use Topics  . Smoking status: Never Smoker   . Smokeless tobacco: Never Used  . Alcohol Use: No   OB History    Gravida Para Term Preterm AB TAB SAB Ectopic Multiple Living   Review of Systems All other systems negative except as documented in the HPI. All pertinent positives and negatives as reviewed in the HPI.  Allergies  Review of patient's allergies indicates no known allergies.  Home Medications   Prior to Admission medications   Medication Sig Start Date End Date Taking? Authorizing Provider  ciprofloxacin (CIPRO) 250 MG tablet Take 1 tablet (250 mg total) by mouth every 12 (twelve) hours. Patient not taking: Reported on 03/22/2015 04/28/14   Marny Lowenstein, PA-C  cyclobenzaprine (FLEXERIL) 5 MG tablet Take 1  tablet (5 mg total) by mouth 3 (three) times daily as needed for muscle spasms. 03/22/15   Richardean Canal, MD  HYDROcodone-acetaminophen (NORCO/VICODIN) 5-325 MG per tablet Take 1-2 tablets by mouth every 6 (six) hours as needed. 03/22/15   Richardean Canal, MD  ibuprofen (ADVIL,MOTRIN) 800 MG tablet Take 1 tablet (800 mg total) by mouth 3 (three) times daily. 03/22/15   Richardean Canal, MD  norgestimate-ethinyl estradiol (ORTHO-CYCLEN,SPRINTEC,PREVIFEM) 0.25-35 MG-MCG tablet Take 1 tablet by mouth daily. Patient not taking: Reported on 03/22/2015 04/28/14   Marny Lowenstein, PA-C  oxyCODONE-acetaminophen (ROXICET) 5-325 MG per tablet Take 1 tablet by mouth every 6 (six) hours as needed for severe pain. Patient not taking: Reported on 03/22/2015 04/28/14   Marny Lowenstein, PA-C   BP 112/81 mmHg  Pulse 75  Temp(Src) 97.9 F (36.6 C) (Oral)  Resp 20  SpO2 100% Physical Exam  Constitutional: She is oriented to person, place, and time. She appears well-developed and well-nourished. No distress.  HENT:  Head: Normocephalic and atraumatic.  Eyes: Pupils are equal, round, and reactive to light.  Cardiovascular: Intact distal pulses.   Pulmonary/Chest: Effort normal.  Neurological: She is alert and oriented to person, place, and time.  Sensation intact in feet bilaterally, great toe slightly mobile with decreased ROM. All other digits decreased ROM, patient unable to move freely.  Skin: Skin is warm and  dry.  Swelling of the lateral malleolus, non tender to palpation.  Swelling, significant tenderness and ecchymoses noted on the proximal anterolateral pedal surface.  Nursing note and vitals reviewed.   ED Course  Procedures (including critical care time) Labs Review  Imaging Review Dg Ankle Complete Left  03/07/2016  CLINICAL DATA:  Pain following fall down steps with twisting type injury EXAM: LEFT ANKLE COMPLETE - 3+ VIEW COMPARISON:  None. FINDINGS: Frontal, oblique, and lateral views were obtained. There is  no demonstrable fracture or joint effusion. The ankle mortise appears intact. There are minimal posterior and inferior calcaneal spurs. IMPRESSION: Minimal calcaneal spurs.  No fracture.  Mortise intact. Electronically Signed   By: Bretta BangWilliam  Woodruff III M.D.   On: 03/07/2016 09:38   I have personally reviewed and evaluated these images and lab results as part of my medical decision-making.   MDM   Assessment: Patient is a 25 y/o female no PMH presents with left ankle injury. Xray shows no obvious fractures noted. Swelling and ecchymoses of anterolateral aspect of left ankle, likely ligamentous sprain. Will encourage patient to rest, ice,  elevate ankle and send patient home on minimal pain medication.  Plan:  Patient be placed in a cam walker, crutches, told to follow up with orthopedics.  Told to return here as needed.  Patient agrees the plan and all questions were answered  Charlestine NightChristopher Wanda Cellucci, PA-C 03/08/16 [id number]  Doug SouSam Jacubowitz, MD 03/08/16 [phone]

## 2016-03-07 NOTE — ED Notes (Signed)
Bed: [id number] Expected date:  Expected time:  Means of arrival:  Comments: EMS- 25 yo fall

## 2021-06-03 ENCOUNTER — Other Ambulatory Visit: Payer: Self-pay

## 2021-06-03 ENCOUNTER — Emergency Department (HOSPITAL_COMMUNITY)
Admission: EM | Admit: 2021-06-03 | Discharge: 2021-06-03 | Disposition: A | Discharge status: Home / Self Care | Payer: Self-pay | Attending: Emergency Medicine | Admitting: Emergency Medicine

## 2021-06-03 DIAGNOSIS — J029 Acute pharyngitis, unspecified: Secondary | ICD-10-CM

## 2021-06-03 DIAGNOSIS — J069 Acute upper respiratory infection, unspecified: Secondary | ICD-10-CM | POA: Insufficient documentation

## 2021-06-03 DIAGNOSIS — Z20822 Contact with and (suspected) exposure to covid-19: Secondary | ICD-10-CM | POA: Insufficient documentation

## 2021-06-03 NOTE — Discharge Instructions (Addendum)
Your tests are pending at the time of discharge. You can view the results on MyChart - strep in about 1-2 hours, COVID in about 6-24 hours.   Take Tylenol and/or ibuprofen for pain and for any fever or aches. Push fluids to avoid dehydration.   IF positive for COVID you must isolate for 5 days, then wear a mask at all times after leaving isolation for an additional 5 days.

## 2021-06-03 NOTE — ED Triage Notes (Signed)
Sore throat times three days. Tonsils enlarged and white. Pt endorses headache and generalized body aches

## 2021-06-03 NOTE — ED Provider Notes (Signed)
[facility]-EMERGENCY DEPT Provider Note   CSN: [id number] Arrival date & time: 06/03/21  2129     History Chief Complaint  Patient presents with   Sore Throat    Gina Bruce is a 30 y.o. female.  Patient to ED with c/o ST, fever, headache, body aches x 3 days. Drinking well, no decreased urination. No vomiting or diarrhea. No known sick contacts. Not COVID vaccinated.   The history is provided by the patient. No language interpreter was used.  Sore Throat Pertinent negatives include no abdominal pain and no shortness of breath.      Past Medical History:  Diagnosis Date   Ectopic pregnancy    Given MTX   Gallstones     Patient Active Problem List   Diagnosis Date Noted   S/P C-section 02/01/2014   Pyelonephritis complicating pregnancy in third trimester, antepartum 01/14/2014   Previous cesarean delivery, desires repeat cesarean delivery 01/14/2014    Past Surgical History:  Procedure Laterality Date   CESAREAN SECTION     CESAREAN SECTION N/A 02/01/2014   Procedure: REPEAT CESAREAN SECTION X 1;  Surgeon: Willodean Rosenthal, MD;  Location: [facility];  Service: Obstetrics;  Laterality: N/A;   DILATION AND CURETTAGE OF UTERUS     INDUCED ABORTION       OB History     Gravida  4   Para  2   Term  2   Preterm      AB  2   Living  2      SAB      IAB  1   Ectopic  1   Multiple      Live Births  1           No family history on file.  Social History   Tobacco Use   Smoking status: Never   Smokeless tobacco: Never  Substance Use Topics   Alcohol use: No   Drug use: No    Home Medications Prior to Admission medications   Medication Sig Start Date End Date Taking? Authorizing Provider  HYDROcodone-acetaminophen (NORCO/VICODIN) 5-325 MG tablet Take 1 tablet by mouth every 6 (six) hours as needed for moderate pain. 03/07/16   Lawyer, Cristal Deer, PA-C  ibuprofen (ADVIL,MOTRIN) 800 MG tablet Take 1 tablet (800 mg  total) by mouth every 8 (eight) hours as needed. 03/07/16   Charlestine Night, PA-C    Allergies    Patient has no known allergies.  Review of Systems   Review of Systems  Constitutional:  Positive for appetite change, chills and fever.  HENT:  Positive for congestion and sore throat.   Respiratory:  Positive for cough. Negative for shortness of breath.   Gastrointestinal:  Negative for abdominal pain, diarrhea and vomiting.  Genitourinary:  Negative for decreased urine volume.  Musculoskeletal:  Positive for myalgias.  Skin:  Negative for rash.   Physical Exam Updated Vital Signs BP 122/80   Pulse (!) 115   Temp 98.8 F (37.1 C)   Resp 18   Ht 5\' 2"  (1.575 m)   Wt 82.1 kg   SpO2 99%   BMI 33.11 kg/m   Physical Exam Vitals and nursing note reviewed.  Constitutional:      Appearance: She is well-developed.  HENT:     Head: Normocephalic.     Nose: Congestion present.     Mouth/Throat:     Mouth: Mucous membranes are moist.     Pharynx: Posterior oropharyngeal erythema present.  Tonsils: No tonsillar exudate. 1+ on the right. 1+ on the left.  Eyes:     Conjunctiva/sclera: Conjunctivae normal.  Cardiovascular:     Rate and Rhythm: Normal rate and regular rhythm.     Heart sounds: No murmur heard. Pulmonary:     Effort: Pulmonary effort is normal.     Breath sounds: Normal breath sounds. No wheezing, rhonchi or rales.  Abdominal:     General: Bowel sounds are normal.     Palpations: Abdomen is soft.     Tenderness: There is no abdominal tenderness. There is no guarding or rebound.  Musculoskeletal:        General: Normal range of motion.     Cervical back: Normal range of motion and neck supple.  Skin:    General: Skin is warm and dry.  Neurological:     General: No focal deficit present.     Mental Status: She is alert and oriented to person, place, and time.    ED Results / Procedures / Treatments   Labs (all labs ordered are listed, but only abnormal  results are displayed) Labs Reviewed  GROUP A STREP BY PCR  SARS CORONAVIRUS 2 (TAT 6-24 HRS)    EKG None  Radiology No results found.  Procedures Procedures   Medications Ordered in ED Medications - No data to display  ED Course  I have reviewed the triage vital signs and the nursing notes.  Pertinent labs & imaging results that were available during my care of the patient were reviewed by me and considered in my medical decision making (see chart for details).    MDM Rules/Calculators/A&P                          Patient to ED with febrile URI symptoms as per HPI. Strep and RVP pending.   Lungs clear, no hypoxia. Mild tachycardia on arrival, normal rate on my exam. Suspect viral illness, doubt PNA, strep.   She can be discharged home. She has been given all precautions.   Final Clinical Impression(s) / ED Diagnoses Final diagnoses:  None   Febrile illness URI Pharyngitis  Rx / DC Orders ED Discharge Orders     None        Danne Harbor 06/03/21 2308    Wynetta Fines, MD 06/03/21 2352

## 2021-06-04 LAB — GROUP A STREP BY PCR: Group A Strep by PCR: NOT DETECTED

## 2021-06-05 LAB — SARS CORONAVIRUS 2 (TAT 6-24 HRS): SARS Coronavirus 2: NEGATIVE

## 2021-10-20 ENCOUNTER — Other Ambulatory Visit: Payer: Self-pay

## 2021-10-20 ENCOUNTER — Ambulatory Visit (HOSPITAL_COMMUNITY)
Admission: EM | Admit: 2021-10-20 | Discharge: 2021-10-20 | Disposition: A | Discharge status: Home / Self Care | Payer: Self-pay | Attending: Medical Oncology | Admitting: Medical Oncology

## 2021-10-20 ENCOUNTER — Encounter (HOSPITAL_COMMUNITY): Payer: Self-pay

## 2021-10-20 DIAGNOSIS — Z202 Contact with and (suspected) exposure to infections with a predominantly sexual mode of transmission: Secondary | ICD-10-CM | POA: Insufficient documentation

## 2021-10-20 MED ORDER — CEFTRIAXONE SODIUM 500 MG IJ SOLR
INTRAMUSCULAR | Status: AC
  Filled 2021-10-20: qty 500

## 2021-10-20 MED ORDER — LIDOCAINE HCL (PF) 1 % IJ SOLN
INTRAMUSCULAR | Status: AC
  Filled 2021-10-20: qty 2

## 2021-10-20 MED ORDER — CEFTRIAXONE SODIUM 500 MG IJ SOLR
500.0000 mg | Freq: Once | INTRAMUSCULAR | Status: AC
  Administered 2021-10-20: 500 mg via INTRAMUSCULAR

## 2021-10-20 NOTE — ED Provider Notes (Addendum)
[facility]    CSN: [id number] Arrival date & time: 10/20/21  1919      History   Chief Complaint Chief Complaint  Patient presents with   SEXUALLY TRANSMITTED DISEASE    HPI Gina Bruce is a 30 y.o. female.   HPI  STI exposure: Patient reports that her partner was recently treated for gonorrhea.  This is her only sexual partner.  She denies any vaginal discharge, pelvic pain or fevers.  She would like to be treated today.  She is agreeable to additional vaginal STI testing.  Past Medical History:  Diagnosis Date   Ectopic pregnancy    Given MTX   Gallstones     Patient Active Problem List   Diagnosis Date Noted   S/P C-section 02/01/2014   Pyelonephritis complicating pregnancy in third trimester, antepartum 01/14/2014   Previous cesarean delivery, desires repeat cesarean delivery 01/14/2014    Past Surgical History:  Procedure Laterality Date   CESAREAN SECTION     CESAREAN SECTION N/A 02/01/2014   Procedure: REPEAT CESAREAN SECTION X 1;  Surgeon: Willodean Rosenthal, MD;  Location: [facility];  Service: Obstetrics;  Laterality: N/A;   DILATION AND CURETTAGE OF UTERUS     INDUCED ABORTION      OB History     Gravida  4   Para  2   Term  2   Preterm      AB  2   Living  2      SAB      IAB  1   Ectopic  1   Multiple      Live Births  1            Home Medications    Prior to Admission medications   Medication Sig Start Date End Date Taking? Authorizing Provider  HYDROcodone-acetaminophen (NORCO/VICODIN) 5-325 MG tablet Take 1 tablet by mouth every 6 (six) hours as needed for moderate pain. 03/07/16   Lawyer, Cristal Deer, PA-C  ibuprofen (ADVIL,MOTRIN) 800 MG tablet Take 1 tablet (800 mg total) by mouth every 8 (eight) hours as needed. 03/07/16   Charlestine Night, PA-C    Family History History reviewed. No pertinent family history.  Social History Social History   Tobacco Use   Smoking status: Never   Smokeless  tobacco: Never  Substance Use Topics   Alcohol use: No   Drug use: No     Allergies   Patient has no known allergies.   Review of Systems Review of Systems  As stated above in HPI Physical Exam Triage Vital Signs ED Triage Vitals  Enc Vitals Group     BP 10/20/21 2002 136/80     Pulse Rate 10/20/21 2002 82     Resp 10/20/21 2002 18     Temp 10/20/21 2002 98.1 F (36.7 C)     Temp Source 10/20/21 2002 Oral     SpO2 10/20/21 2002 100 %     Weight --      Height --      Head Circumference --      Peak Flow --      Pain Score 10/20/21 2001 0     Pain Loc --      Pain Edu? --      Excl. in GC? --    No data found.  Updated Vital Signs BP 136/80 (BP Location: Right Arm)   Pulse 82   Temp 98.1 F (36.7 C) (Oral)   Resp 18  LMP 10/15/2021 (Approximate)   SpO2 100%   Physical Exam Vitals and nursing note reviewed.  Constitutional:      General: She is not in acute distress.    Appearance: Normal appearance. She is not ill-appearing, toxic-appearing or diaphoretic.  Cardiovascular:     Rate and Rhythm: Normal rate and regular rhythm.     Heart sounds: Normal heart sounds.  Pulmonary:     Effort: Pulmonary effort is normal.     Breath sounds: Normal breath sounds.  Abdominal:     General: Bowel sounds are normal.     Palpations: Abdomen is soft.     Tenderness: There is no abdominal tenderness. There is no right CVA tenderness, left CVA tenderness or guarding.  Neurological:     Mental Status: She is alert.     UC Treatments / Results  Labs (all labs ordered are listed, but only abnormal results are displayed) Labs Reviewed - No data to display  EKG   Radiology No results found.  Procedures Procedures (including critical care time)  Medications Ordered in UC Medications  cefTRIAXone (ROCEPHIN) injection 500 mg (has no administration in time range)    Initial Impression / Assessment and Plan / UC Course  I have reviewed the triage vital signs  and the nursing notes.  Pertinent labs & imaging results that were available during my care of the patient were reviewed by me and considered in my medical decision making (see chart for details).     New.  Cytology pending.  Treating for gonorrhea in office today to prevent systemic complications.  Safe sex handout. Final Clinical Impressions(s) / UC Diagnoses   Final diagnoses:  None   Discharge Instructions   None    ED Prescriptions   None    PDMP not reviewed this encounter.   Rushie Chestnut, PA-C 10/20/21 2015    Rushie Chestnut, PA-C 10/20/21 2017

## 2021-10-20 NOTE — ED Triage Notes (Signed)
Pt presents for STD testing. States she is unsure if her partner tested positive for Gonorrhea or Chlamydia.  States she does not have sxs.

## 2021-10-23 LAB — CERVICOVAGINAL ANCILLARY ONLY
Bacterial Vaginitis (gardnerella): POSITIVE — AB
Candida Glabrata: NEGATIVE
Candida Vaginitis: NEGATIVE
Chlamydia: NEGATIVE
Comment: NEGATIVE
Comment: NEGATIVE
Comment: NEGATIVE
Comment: NEGATIVE
Comment: NEGATIVE
Comment: NORMAL
Neisseria Gonorrhea: POSITIVE — AB
Trichomonas: NEGATIVE

## 2021-10-24 ENCOUNTER — Telehealth (HOSPITAL_COMMUNITY): Payer: Self-pay | Admitting: Emergency Medicine

## 2021-10-24 MED ORDER — METRONIDAZOLE 500 MG PO TABS: 500.0000 mg | ORAL_TABLET | Freq: Two times a day (BID) | ORAL | 0 refills | Status: AC

## 2022-11-07 ENCOUNTER — Other Ambulatory Visit: Payer: Self-pay | Admitting: Nurse Practitioner

## 2022-11-07 DIAGNOSIS — N644 Mastodynia: Secondary | ICD-10-CM

## 2022-12-14 ENCOUNTER — Other Ambulatory Visit: Payer: Self-pay

## 2023-09-24 ENCOUNTER — Ambulatory Visit (HOSPITAL_COMMUNITY)
Admission: EM | Admit: 2023-09-24 | Discharge: 2023-09-24 | Disposition: A | Discharge status: Home / Self Care | Payer: Self-pay | Attending: Internal Medicine | Admitting: Internal Medicine

## 2023-09-24 ENCOUNTER — Ambulatory Visit (INDEPENDENT_AMBULATORY_CARE_PROVIDER_SITE_OTHER): Payer: Self-pay

## 2023-09-24 ENCOUNTER — Encounter (HOSPITAL_COMMUNITY): Payer: Self-pay

## 2023-09-24 DIAGNOSIS — S93402A Sprain of unspecified ligament of left ankle, initial encounter: Secondary | ICD-10-CM

## 2023-09-24 MED ORDER — IBUPROFEN 800 MG PO TABS: 800.0000 mg | ORAL_TABLET | Freq: Three times a day (TID) | ORAL | 0 refills | Status: AC

## 2023-09-24 NOTE — ED Provider Notes (Signed)
[facility]    CSN: [id number] Arrival date & time: 09/24/23  1051      History   Chief Complaint Chief Complaint  Patient presents with   Foot Injury    HPI Gina Bruce is a 32 y.o. female.   Gina Bruce is a 32 y.o. female presenting for chief complaint of foot injury that happened 4 days ago.  Patient was attempting to sit down in a metal folding chair when the chair collapsed underneath her causing her to fall onto the left side landing onto her left arm.  During the fall, she struck her left ankle to the metal chair and has had pain and swelling ever since to the medial malleolus and the posterior left ankle. She has been able to walk with slight limp favoring the left ankle/foot. Denies paresthesias and extremity weakness distally to injury.  Denies previous injury to the left ankle. She has not attempted use of any OTC medicines PTA.    Foot Injury   Past Medical History:  Diagnosis Date   Ectopic pregnancy    Given MTX   Gallstones     Patient Active Problem List   Diagnosis Date Noted   S/P C-section 02/01/2014   Pyelonephritis affecting pregnancy in third trimester 01/14/2014   Previous cesarean delivery, desires repeat cesarean delivery 01/14/2014    Past Surgical History:  Procedure Laterality Date   CESAREAN SECTION     CESAREAN SECTION N/A 02/01/2014   Procedure: REPEAT CESAREAN SECTION X 1;  Surgeon: Willodean Rosenthal, MD;  Location: [facility];  Service: Obstetrics;  Laterality: N/A;   DILATION AND CURETTAGE OF UTERUS     INDUCED ABORTION      OB History     Gravida  4   Para  2   Term  2   Preterm      AB  2   Living  2      SAB      IAB  1   Ectopic  1   Multiple      Live Births  1            Home Medications    Prior to Admission medications   Medication Sig Start Date End Date Taking? Authorizing Provider  ibuprofen (ADVIL) 800 MG tablet Take 1 tablet (800 mg total) by mouth 3 (three) times  daily. 09/24/23  Yes Carlisle Beers, FNP  HYDROcodone-acetaminophen (NORCO/VICODIN) 5-325 MG tablet Take 1 tablet by mouth every 6 (six) hours as needed for moderate pain. 03/07/16   Lawyer, Cristal Deer, PA-C  metroNIDAZOLE (FLAGYL) 500 MG tablet Take 1 tablet (500 mg total) by mouth 2 (two) times daily. 10/24/21   LampteyBritta Mccreedy, MD    Family History History reviewed. No pertinent family history.  Social History Social History   Tobacco Use   Smoking status: Never   Smokeless tobacco: Never  Vaping Use   Vaping status: Never Used  Substance Use Topics   Alcohol use: Yes    Comment: occassional   Drug use: No     Allergies   Patient has no known allergies.   Review of Systems Review of Systems Per HPI  Physical Exam Triage Vital Signs ED Triage Vitals  Encounter Vitals Group     BP 09/24/23 1120 109/75     Systolic BP Percentile --      Diastolic BP Percentile --      Pulse Rate 09/24/23 1120 81  Resp 09/24/23 1120 16     Temp 09/24/23 1120 98 F (36.7 C)     Temp Source 09/24/23 1120 Oral     SpO2 09/24/23 1120 95 %     Weight --      Height --      Head Circumference --      Peak Flow --      Pain Score 09/24/23 1122 8     Pain Loc --      Pain Education --      Exclude from Growth Chart --    No data found.  Updated Vital Signs BP 109/75 (BP Location: Left Arm)   Pulse 81   Temp 98 F (36.7 C) (Oral)   Resp 16   LMP 09/11/2023 (Approximate)   SpO2 95%   Visual Acuity Right Eye Distance:   Left Eye Distance:   Bilateral Distance:    Right Eye Near:   Left Eye Near:    Bilateral Near:     Physical Exam Vitals and nursing note reviewed.  Constitutional:      Appearance: She is not ill-appearing or toxic-appearing.  HENT:     Head: Normocephalic and atraumatic.     Right Ear: Hearing and external ear normal.     Left Ear: Hearing and external ear normal.     Nose: Nose normal.     Mouth/Throat:     Lips: Pink.  Eyes:      General: Lids are normal. Vision grossly intact. Gaze aligned appropriately.     Extraocular Movements: Extraocular movements intact.     Conjunctiva/sclera: Conjunctivae normal.  Cardiovascular:     Rate and Rhythm: Normal rate and regular rhythm.     Heart sounds: Normal heart sounds, S1 normal and S2 normal.  Pulmonary:     Effort: Pulmonary effort is normal. No respiratory distress.     Breath sounds: Normal breath sounds and air entry.  Musculoskeletal:     Cervical back: Neck supple.     Left ankle: Swelling (Subtle swelling to the medial malleolus of the left ankle) present. No deformity, ecchymosis or lacerations. Tenderness present over the medial malleolus. Normal range of motion. Anterior drawer test negative. Normal pulse.     Left Achilles Tendon: No tenderness. Thompson's test negative.     Right foot: Normal.     Left foot: Normal.  Skin:    General: Skin is warm and dry.     Capillary Refill: Capillary refill takes less than 2 seconds.     Findings: No rash.  Neurological:     General: No focal deficit present.     Mental Status: She is alert and oriented to person, place, and time. Mental status is at baseline.     Cranial Nerves: No dysarthria or facial asymmetry.  Psychiatric:        Mood and Affect: Mood normal.        Speech: Speech normal.        Behavior: Behavior normal.        Thought Content: Thought content normal.        Judgment: Judgment normal.      UC Treatments / Results  Labs (all labs ordered are listed, but only abnormal results are displayed) Labs Reviewed - No data to display  EKG   Radiology No results found.  Procedures Procedures (including critical care time)  Medications Ordered in UC Medications - No data to display  Initial Impression / Assessment and Plan /  UC Course  I have reviewed the triage vital signs and the nursing notes.  Pertinent labs & imaging results that were available during my care of the patient were  reviewed by me and considered in my medical decision making (see chart for details).   1. Sprain of left ankle Imaging is negative for acute fracture or dislocation by my interpretation.  Staff will call patient if radiology re-read shows different interpretation indicating change in treatment plan. We will manage this with conservative treatment as an acute sprain to the left ankle.  RICE advised.   Ankle ASO lace up brace applied in clinic and patient may use this as needed for compression and stability.  May use ibuprofen as needed for pain and swelling at home.  Walking referral to orthopedics given should symptoms fail to improve in the next few weeks with conservative care.   Counseled patient on potential for adverse effects with medications prescribed/recommended today, strict ER and return-to-clinic precautions discussed, patient verbalized understanding.    Final Clinical Impressions(s) / UC Diagnoses   Final diagnoses:  Sprain of left ankle, unspecified ligament, initial encounter     Discharge Instructions      Your x-rays of your ankle were negative for fracture or dislocation. You likely sprained your ankle   Wear the wrist brace we provided in the clinic for the next couple of weeks to provide compression, stability, and comfort.  Please rest, ice, and elevate your foot/ankle to help it heal and decrease inflammation.   Take 600mg  ibuprofen and/or 1,000mg  tylenol every 6 hours as needed for pain and inflammation. Take with food to avoid stomach upset.  Call the orthopedic provider listed on your discharge paperwork to schedule a follow-up appointment if your symptoms do not improve in the next 1-2 weeks with supportive care.  Return to urgent care if you experience worsening pain, numbness, tingling, change of color in your skin near the injury, or any other concerning symptoms.  I hope you feel better!   ED Prescriptions     Medication Sig Dispense Auth.  Provider   ibuprofen (ADVIL) 800 MG tablet Take 1 tablet (800 mg total) by mouth 3 (three) times daily. 21 tablet Carlisle Beers, FNP      PDMP not reviewed this encounter.   Carlisle Beers, [place] 09/24/23 1225

## 2023-09-24 NOTE — ED Triage Notes (Addendum)
Pt complaining of left foot & left leg swelling after falling out of a metal chair, denies head injury. Pt reports "it feels like my toes are tingling, feels like they are asleep." Pt currently rates her pain as an 8/10, reports she cannot bear weight on LLE. Pt states she has iced the area with an ice pack, however has not taken any medications for her pain. Pt is ambulatory to triage room.

## 2023-09-24 NOTE — Discharge Instructions (Signed)
Your x-rays of your ankle were negative for fracture or dislocation. You likely sprained your ankle   Wear the wrist brace we provided in the clinic for the next couple of weeks to provide compression, stability, and comfort.  Please rest, ice, and elevate your foot/ankle to help it heal and decrease inflammation.   Take 600mg  ibuprofen and/or 1,000mg  tylenol every 6 hours as needed for pain and inflammation. Take with food to avoid stomach upset.  Call the orthopedic provider listed on your discharge paperwork to schedule a follow-up appointment if your symptoms do not improve in the next 1-2 weeks with supportive care.  Return to urgent care if you experience worsening pain, numbness, tingling, change of color in your skin near the injury, or any other concerning symptoms.  I hope you feel better!

## 2024-12-25 ENCOUNTER — Encounter (HOSPITAL_COMMUNITY): Payer: Self-pay

## 2024-12-25 ENCOUNTER — Emergency Department (HOSPITAL_COMMUNITY)
Admission: EM | Admit: 2024-12-25 | Discharge: 2024-12-25 | Discharge status: Against Medical Advice | Payer: Self-pay | Attending: Emergency Medicine | Admitting: Emergency Medicine

## 2024-12-25 DIAGNOSIS — Z5321 Procedure and treatment not carried out due to patient leaving prior to being seen by health care provider: Secondary | ICD-10-CM | POA: Insufficient documentation

## 2024-12-25 DIAGNOSIS — R112 Nausea with vomiting, unspecified: Secondary | ICD-10-CM | POA: Insufficient documentation

## 2024-12-25 DIAGNOSIS — R1031 Right lower quadrant pain: Secondary | ICD-10-CM | POA: Insufficient documentation

## 2024-12-25 DIAGNOSIS — N644 Mastodynia: Secondary | ICD-10-CM | POA: Insufficient documentation

## 2024-12-25 LAB — COMPREHENSIVE METABOLIC PANEL WITH GFR
ALT: 21 U/L (ref 0–44)
AST: 19 U/L (ref 15–41)
Albumin: 4 g/dL (ref 3.5–5.0)
Alkaline Phosphatase: 51 U/L (ref 38–126)
Anion gap: 8 (ref 5–15)
BUN: 8 mg/dL (ref 6–20)
CO2: 24 mmol/L (ref 22–32)
Calcium: 9.3 mg/dL (ref 8.9–10.3)
Chloride: 107 mmol/L (ref 98–111)
Creatinine, Ser: 0.81 mg/dL (ref 0.44–1.00)
GFR, Estimated: >60 mL/min
Glucose, Bld: 98 mg/dL (ref 70–99)
Potassium: 3.7 mmol/L (ref 3.5–5.1)
Sodium: 139 mmol/L (ref 135–145)
Total Bilirubin: 0.8 mg/dL (ref 0.0–1.2)
Total Protein: 7.1 g/dL (ref 6.5–8.1)

## 2024-12-25 LAB — CBC WITH DIFFERENTIAL/PLATELET
Abs Immature Granulocytes: 0.02 K/uL (ref 0.00–0.07)
Basophils Absolute: 0.1 K/uL (ref 0.0–0.1)
Basophils Relative: 1 %
Eosinophils Absolute: 0.1 K/uL (ref 0.0–0.5)
Eosinophils Relative: 1 %
HCT: 41.7 % (ref 36.0–46.0)
Hemoglobin: 14.2 g/dL (ref 12.0–15.0)
Immature Granulocytes: 0 %
Lymphocytes Relative: 30 %
Lymphs Abs: 2.7 K/uL (ref 0.7–4.0)
MCH: 27.5 pg (ref 26.0–34.0)
MCHC: 34.1 g/dL (ref 30.0–36.0)
MCV: 80.8 fL (ref 80.0–100.0)
Monocytes Absolute: 0.9 K/uL (ref 0.1–1.0)
Monocytes Relative: 10 %
Neutro Abs: 5.4 K/uL (ref 1.7–7.7)
Neutrophils Relative %: 58 %
Platelets: 359 K/uL (ref 150–400)
RBC: 5.16 MIL/uL — ABNORMAL HIGH (ref 3.87–5.11)
RDW: 13.9 % (ref 11.5–15.5)
WBC: 9.2 K/uL (ref 4.0–10.5)
nRBC: 0 % (ref 0.0–0.2)

## 2024-12-25 LAB — URINALYSIS, ROUTINE W REFLEX MICROSCOPIC
Bacteria, UA: NONE SEEN
Bilirubin Urine: NEGATIVE
Glucose, UA: NEGATIVE mg/dL
Hgb urine dipstick: NEGATIVE
Ketones, ur: NEGATIVE mg/dL
Nitrite: NEGATIVE
Protein, ur: NEGATIVE mg/dL
Specific Gravity, Urine: 1.009 (ref 1.005–1.030)
pH: 8 (ref 5.0–8.0)

## 2024-12-25 LAB — LIPASE, BLOOD: Lipase: 24 U/L (ref 11–51)

## 2024-12-25 LAB — PREGNANCY, URINE: Preg Test, Ur: NEGATIVE

## 2024-12-25 NOTE — ED Provider Triage Note (Signed)
 Emergency Medicine Provider Triage Evaluation Note  Gina Bruce , a 34 y.o. female  was evaluated in triage.  Pt complains of right sided lower abdominal pain for 2 weeks..  Associated with some nausea and vomiting.  Breasts were tender.  Took a home pregnancy test that was negative.  Unable to see PCP for few more weeks.  No urinary symptoms no vaginal bleeding or discharge.  Has irregular cycles.  Review of Systems  Positive: Abdominal pain Negative: Urinary symptoms  Physical Exam  BP 124/66 (BP Location: Left Arm)   Pulse 68   Temp 98.1 F (36.7 C) (Oral)   Resp 16   SpO2 99%  Gen:   Awake, no distress   Resp:  Normal effort  MSK:   Moves extremities without difficulty  Other:    Medical Decision Making  Medically screening exam initiated at 11:55 AM.  Appropriate orders placed.  Gina Bruce was informed that the remainder of the evaluation will be completed by another provider, this initial triage assessment does not replace that evaluation, and the importance of remaining in the ED until their evaluation is complete.     Gina Ozell BROCKS, MD 12/25/24 [phone]

## 2024-12-25 NOTE — ED Notes (Signed)
 Called 2x no response for ED room

## 2024-12-25 NOTE — ED Triage Notes (Signed)
 Pt here from home with c/o right lower abd pain , ongoing for about 1 week , no urinary symptoms

## 2024-12-25 NOTE — ED Notes (Signed)
 This RN called for pt in lobby, no answer at this time charge RN notified.

## 2024-12-25 NOTE — ED Triage Notes (Signed)
 Pt c/o LLQ abdominal pain with some n/v/d that started a week ago but has increased over the past couple days. Also reports breast tenderness but reports a negative at home pregnancy test.
